# Patient Record
Sex: Female | Born: 1965 | Race: White | Hispanic: No | Marital: Married | Smoking: Former smoker
Health system: Southern US, Community
[De-identification: ages and names within clinical notes are randomized; demographics above are authoritative.]

## PROBLEM LIST (undated history)

## (undated) DIAGNOSIS — E079 Disorder of thyroid, unspecified: Secondary | ICD-10-CM

## (undated) DIAGNOSIS — I10 Essential (primary) hypertension: Secondary | ICD-10-CM

## (undated) DIAGNOSIS — R32 Unspecified urinary incontinence: Secondary | ICD-10-CM

## (undated) DIAGNOSIS — H9190 Unspecified hearing loss, unspecified ear: Secondary | ICD-10-CM

## (undated) DIAGNOSIS — E119 Type 2 diabetes mellitus without complications: Secondary | ICD-10-CM

## (undated) DIAGNOSIS — N951 Menopausal and female climacteric states: Secondary | ICD-10-CM

## (undated) HISTORY — DX: Unspecified hearing loss, unspecified ear: H91.90

## (undated) HISTORY — DX: Unspecified urinary incontinence: R32

## (undated) HISTORY — DX: Essential (primary) hypertension: I10

## (undated) HISTORY — PX: TONSILLECTOMY AND ADENOIDECTOMY: SUR1326

## (undated) HISTORY — DX: Disorder of thyroid, unspecified: E07.9

## (undated) HISTORY — DX: Menopausal and female climacteric states: N95.1

---

## 1985-12-18 HISTORY — PX: WISDOM TOOTH EXTRACTION: SHX21

## 1998-06-12 ENCOUNTER — Inpatient Hospital Stay (HOSPITAL_COMMUNITY): Admission: AD | Admit: 1998-06-12 | Discharge: 1998-06-12 | Payer: Self-pay | Admitting: *Deleted

## 1998-07-06 ENCOUNTER — Inpatient Hospital Stay (HOSPITAL_COMMUNITY): Admission: AD | Admit: 1998-07-06 | Discharge: 1998-07-06 | Payer: Self-pay | Admitting: *Deleted

## 1998-08-10 ENCOUNTER — Ambulatory Visit (HOSPITAL_COMMUNITY): Admission: RE | Admit: 1998-08-10 | Discharge: 1998-08-10 | Payer: Self-pay | Admitting: *Deleted

## 1998-10-05 ENCOUNTER — Emergency Department (HOSPITAL_COMMUNITY): Admission: EM | Admit: 1998-10-05 | Discharge: 1998-10-05 | Payer: Self-pay | Admitting: Emergency Medicine

## 1999-01-03 ENCOUNTER — Ambulatory Visit (HOSPITAL_COMMUNITY): Admission: RE | Admit: 1999-01-03 | Discharge: 1999-01-03 | Payer: Self-pay | Admitting: *Deleted

## 1999-01-13 ENCOUNTER — Inpatient Hospital Stay (HOSPITAL_COMMUNITY): Admission: AD | Admit: 1999-01-13 | Discharge: 1999-01-13 | Payer: Self-pay | Admitting: *Deleted

## 1999-01-18 ENCOUNTER — Inpatient Hospital Stay (HOSPITAL_COMMUNITY): Admission: AD | Admit: 1999-01-18 | Discharge: 1999-01-20 | Payer: Self-pay | Admitting: *Deleted

## 1999-03-20 ENCOUNTER — Emergency Department (HOSPITAL_COMMUNITY): Admission: EM | Admit: 1999-03-20 | Discharge: 1999-03-20 | Payer: Self-pay | Admitting: Emergency Medicine

## 1999-03-28 ENCOUNTER — Emergency Department (HOSPITAL_COMMUNITY): Admission: EM | Admit: 1999-03-28 | Discharge: 1999-03-28 | Payer: Self-pay | Admitting: Emergency Medicine

## 1999-03-28 ENCOUNTER — Encounter: Payer: Self-pay | Admitting: Emergency Medicine

## 1999-03-31 ENCOUNTER — Encounter: Payer: Self-pay | Admitting: Emergency Medicine

## 1999-03-31 ENCOUNTER — Ambulatory Visit (HOSPITAL_COMMUNITY): Admission: RE | Admit: 1999-03-31 | Discharge: 1999-03-31 | Payer: Self-pay | Admitting: Emergency Medicine

## 2001-03-07 ENCOUNTER — Emergency Department (HOSPITAL_COMMUNITY): Admission: EM | Admit: 2001-03-07 | Discharge: 2001-03-07 | Payer: Self-pay | Admitting: Emergency Medicine

## 2001-09-01 ENCOUNTER — Encounter: Payer: Self-pay | Admitting: Obstetrics and Gynecology

## 2001-09-01 ENCOUNTER — Inpatient Hospital Stay (HOSPITAL_COMMUNITY): Admission: AD | Admit: 2001-09-01 | Discharge: 2001-09-01 | Payer: Self-pay | Admitting: Obstetrics and Gynecology

## 2001-09-02 ENCOUNTER — Inpatient Hospital Stay (HOSPITAL_COMMUNITY): Admission: AD | Admit: 2001-09-02 | Discharge: 2001-09-02 | Payer: Self-pay | Admitting: Obstetrics and Gynecology

## 2001-12-18 HISTORY — PX: TUBAL LIGATION: SHX77

## 2002-07-22 ENCOUNTER — Inpatient Hospital Stay (HOSPITAL_COMMUNITY): Admission: AD | Admit: 2002-07-22 | Discharge: 2002-07-24 | Payer: Self-pay | Admitting: Obstetrics & Gynecology

## 2002-08-26 ENCOUNTER — Other Ambulatory Visit: Admission: RE | Admit: 2002-08-26 | Discharge: 2002-08-26 | Payer: Self-pay | Admitting: Obstetrics & Gynecology

## 2002-09-03 ENCOUNTER — Encounter (INDEPENDENT_AMBULATORY_CARE_PROVIDER_SITE_OTHER): Payer: Self-pay

## 2002-09-03 ENCOUNTER — Ambulatory Visit (HOSPITAL_COMMUNITY): Admission: RE | Admit: 2002-09-03 | Discharge: 2002-09-03 | Payer: Self-pay | Admitting: Obstetrics & Gynecology

## 2003-02-11 ENCOUNTER — Emergency Department (HOSPITAL_COMMUNITY): Admission: EM | Admit: 2003-02-11 | Discharge: 2003-02-11 | Payer: Self-pay | Admitting: Emergency Medicine

## 2003-02-11 ENCOUNTER — Encounter: Payer: Self-pay | Admitting: Emergency Medicine

## 2003-06-07 ENCOUNTER — Emergency Department (HOSPITAL_COMMUNITY): Admission: EM | Admit: 2003-06-07 | Discharge: 2003-06-07 | Payer: Self-pay | Admitting: Emergency Medicine

## 2004-04-11 ENCOUNTER — Emergency Department (HOSPITAL_COMMUNITY): Admission: EM | Admit: 2004-04-11 | Discharge: 2004-04-11 | Payer: Self-pay

## 2006-04-18 ENCOUNTER — Encounter: Payer: Self-pay | Admitting: *Deleted

## 2006-05-17 ENCOUNTER — Emergency Department (HOSPITAL_COMMUNITY): Admission: EM | Admit: 2006-05-17 | Discharge: 2006-05-17 | Payer: Self-pay | Admitting: Emergency Medicine

## 2006-05-25 ENCOUNTER — Emergency Department (HOSPITAL_COMMUNITY): Admission: EM | Admit: 2006-05-25 | Discharge: 2006-05-25 | Payer: Self-pay | Admitting: Emergency Medicine

## 2007-11-26 ENCOUNTER — Emergency Department (HOSPITAL_COMMUNITY): Admission: EM | Admit: 2007-11-26 | Discharge: 2007-11-26 | Payer: Self-pay | Admitting: Emergency Medicine

## 2008-09-27 ENCOUNTER — Emergency Department (HOSPITAL_COMMUNITY): Admission: EM | Admit: 2008-09-27 | Discharge: 2008-09-27 | Payer: Self-pay | Admitting: Emergency Medicine

## 2010-03-30 ENCOUNTER — Ambulatory Visit: Payer: Self-pay | Admitting: Obstetrics and Gynecology

## 2010-04-01 ENCOUNTER — Ambulatory Visit (HOSPITAL_COMMUNITY): Admission: RE | Admit: 2010-04-01 | Discharge: 2010-04-01 | Payer: Self-pay | Admitting: Obstetrics & Gynecology

## 2010-04-07 ENCOUNTER — Encounter: Admission: RE | Admit: 2010-04-07 | Discharge: 2010-04-07 | Payer: Self-pay | Admitting: Obstetrics & Gynecology

## 2010-04-13 ENCOUNTER — Other Ambulatory Visit: Admission: RE | Admit: 2010-04-13 | Discharge: 2010-04-13 | Payer: Self-pay | Admitting: Obstetrics and Gynecology

## 2010-04-13 ENCOUNTER — Ambulatory Visit: Payer: Self-pay | Admitting: Obstetrics & Gynecology

## 2010-04-28 ENCOUNTER — Ambulatory Visit: Payer: Self-pay | Admitting: Obstetrics and Gynecology

## 2010-04-28 LAB — CONVERTED CEMR LAB
HCT: 43.8 % (ref 36.0–46.0)
Hemoglobin: 15.1 g/dL — ABNORMAL HIGH (ref 12.0–15.0)
MCHC: 34.5 g/dL (ref 30.0–36.0)

## 2010-06-08 ENCOUNTER — Emergency Department (HOSPITAL_COMMUNITY): Admission: EM | Admit: 2010-06-08 | Discharge: 2010-06-08 | Payer: Self-pay | Admitting: Emergency Medicine

## 2010-07-14 ENCOUNTER — Ambulatory Visit: Payer: Self-pay | Admitting: Obstetrics and Gynecology

## 2010-08-10 ENCOUNTER — Ambulatory Visit: Payer: Self-pay | Admitting: Obstetrics & Gynecology

## 2010-08-10 LAB — CONVERTED CEMR LAB
CO2: 21 meq/L (ref 19–32)
Calcium: 9.5 mg/dL (ref 8.4–10.5)
Creatinine, Ser: 0.71 mg/dL (ref 0.40–1.20)
Glucose, Bld: 110 mg/dL — ABNORMAL HIGH (ref 70–99)

## 2010-09-30 ENCOUNTER — Ambulatory Visit: Payer: Self-pay | Admitting: Family Medicine

## 2010-10-11 ENCOUNTER — Ambulatory Visit (HOSPITAL_COMMUNITY): Admission: RE | Admit: 2010-10-11 | Discharge: 2010-10-11 | Payer: Self-pay | Admitting: *Deleted

## 2010-10-11 ENCOUNTER — Ambulatory Visit: Payer: Self-pay | Admitting: Obstetrics & Gynecology

## 2010-10-11 HISTORY — PX: ENDOMETRIAL ABLATION W/ NOVASURE: SUR434

## 2010-12-23 ENCOUNTER — Ambulatory Visit: Admit: 2010-12-23 | Payer: Self-pay | Admitting: Family Medicine

## 2011-03-01 LAB — CBC
HCT: 42.7 % (ref 36.0–46.0)
Hemoglobin: 14.7 g/dL (ref 12.0–15.0)
MCV: 92.2 fL (ref 78.0–100.0)
Platelets: 318 10*3/uL (ref 150–400)
RBC: 4.62 MIL/uL (ref 3.87–5.11)
WBC: 11.1 10*3/uL — ABNORMAL HIGH (ref 4.0–10.5)

## 2011-03-05 LAB — URINALYSIS, ROUTINE W REFLEX MICROSCOPIC
Protein, ur: 100 mg/dL — AB
Specific Gravity, Urine: 1.025 (ref 1.005–1.030)
Urobilinogen, UA: 0.2 mg/dL (ref 0.0–1.0)

## 2011-03-05 LAB — CBC
HCT: 42.5 % (ref 36.0–46.0)
Hemoglobin: 15.1 g/dL — ABNORMAL HIGH (ref 12.0–15.0)
MCH: 33.9 pg (ref 26.0–34.0)
MCHC: 35.5 g/dL (ref 30.0–36.0)
MCV: 95.5 fL (ref 78.0–100.0)
Platelets: 321 10*3/uL (ref 150–400)
RBC: 4.45 MIL/uL (ref 3.87–5.11)
RDW: 12.7 % (ref 11.5–15.5)
WBC: 13.4 10*3/uL — ABNORMAL HIGH (ref 4.0–10.5)

## 2011-03-05 LAB — DIFFERENTIAL
Basophils Relative: 0 % (ref 0–1)
Lymphs Abs: 3.1 10*3/uL (ref 0.7–4.0)
Neutro Abs: 9 10*3/uL — ABNORMAL HIGH (ref 1.7–7.7)
Neutrophils Relative %: 67 % (ref 43–77)

## 2011-03-05 LAB — URINE CULTURE

## 2011-03-05 LAB — URINE MICROSCOPIC-ADD ON

## 2011-03-05 LAB — WET PREP, GENITAL

## 2011-03-07 LAB — POCT PREGNANCY, URINE: Preg Test, Ur: NEGATIVE

## 2011-03-08 LAB — POCT URINALYSIS DIP (DEVICE)
Hgb urine dipstick: NEGATIVE
Protein, ur: NEGATIVE mg/dL
Specific Gravity, Urine: >=1.03 (ref 1.005–1.030)
Urobilinogen, UA: 0.2 mg/dL (ref 0.0–1.0)
pH: 5 (ref 5.0–8.0)

## 2011-03-12 ENCOUNTER — Emergency Department (HOSPITAL_COMMUNITY): Admission: EM | Admit: 2011-03-12 | Payer: Self-pay | Source: Home / Self Care

## 2011-05-05 NOTE — Discharge Summary (Signed)
   NAME:  [name], [name]                         ACCOUNT NO.:  [id number]   MEDICAL RECORD NO.:  [id number]                   PATIENT TYPE:  INP   LOCATION:  9109                                 FACILITY:  [facility]   PHYSICIAN:  Gerrit Friends. Aldona Bar, M.D.                DATE OF BIRTH:  [birth year]   DATE OF ADMISSION:  07/22/2002  DATE OF DISCHARGE:  07/24/2002                                 DISCHARGE SUMMARY   DISCHARGE DIAGNOSES:  1. Term pregnancy, delivered, 7 pound 13 ounce female infant, Apgar's 9 and 9.  2. Blood type O+.   PROCEDURE:  1. Normal spontaneous delivery.  2. Episiotomy and repair.   SUMMARY:  This 44 year old gravida 4, para 2, was admitted at term in labor  after a relatively uncomplicated pregnancy.  She had a good progression of  her labor with a subsequent normal spontaneous delivery of a viable female  infant with Apgar's of 9 and 9, weighing 7 pounds 13 ounces over a midline  episiotomy which was subsequently repaired without difficulty.  Her  postpartum course was benign.  Her discharge hemoglobin was 12.3, with a  white blood cell count of 16,800, and a platelet count of 391,000.  On the  morning of 07/24/02, she was ambulating well, tolerating a regular diet well,  bottle feeding without difficulty, having normal bowel and bladder function,  was afebrile, and her vital signs were stable, and she was ready for  discharge.  Accordingly, she was given all appropriate instructions and  understood all instructions well.   DISCHARGE MEDICATIONS:  1. Vitamins one q.d.  2. Motrin 600 mg q.6h. p.r.n.  3. Tylox one or two q.4-6h. p.r.n. severe pain.   FOLLOWUP:  She will return to the office for followup in approximately four  weeks' time.   CONDITION ON DISCHARGE:  Improved.                                               Gerrit Friends. Aldona Bar, M.D.    RMW/MEDQ  D:  07/24/2002  T:  07/26/2002  Job:  [id number]

## 2011-05-05 NOTE — Op Note (Signed)
NAME:  [name], [name]                         ACCOUNT NO.:  [id number]   MEDICAL RECORD NO.:  [id number]                   PATIENT TYPE:  AMB   LOCATION:  SDC                                  FACILITY:  [facility]   PHYSICIAN:  Ilda Mori, M.D.                DATE OF BIRTH:  11-18-1966   DATE OF PROCEDURE:  09/03/2002  DATE OF DISCHARGE:                                 OPERATIVE REPORT   PREOPERATIVE DIAGNOSES:  Voluntary sterilization.   POSTOPERATIVE DIAGNOSES:  Voluntary sterilization, 5 cm right paratubal  cyst.   PROCEDURE:  Laparoscopic cautery of the left fallopian tube and distal  salpingostomy and removal of right paratubal cyst.   SURGEON:  Ilda Mori, M.D.   ANESTHESIA:  General endotracheal.   ESTIMATED BLOOD LOSS:  20 cc.   FINDINGS:  The left adnexa were normal.  The right adnexa showed a 5 mm  paratubal cyst on the distal portion of the fallopian tube.  The right ovary  was normal.   INDICATIONS:  This is a 45 year old gravida 4, para 3, AB1 who requested  permanent sterilization.  Alternatives were discussed with the patient as  well as the failure rate for tubal ligation and the fact that tubal ligation  was permanent.  The patient understood this information and elected to  proceed.   PROCEDURE:  The patient was brought to the operating room and placed in the  supine position.  General endotracheal anesthesia was induced.  The abdomen  was prepped and draped in a sterile fashion.  The patient was placed in the  dorsal lithotomy position.  The vagina and perineum were also prepped and  draped in a sterile fashion.  A Hulka tenaculum was placed in the  endocervical canal and fixed to the anterior lip of the cervix.  The uterus  and cervix were elevated and a Veress needle was introduced to the cul-de-  sac and a pneumoperitoneum was created.  The surgeon regowned and gloved.  A  small incision was made at the base of the umbilicus and a 5 mm probe was  placed under direct visualization with a 5 mm laparoscope.  An accessory  instrument was placed through a suprapubic stab wound.  The pelvis was  viewed and the paratubal cyst was noted to be present and it was elected to  remove this.  Another 5 mm port was placed in the right lower quadrant and  the 5 mm port in the suprapubic area was enlarged to a 10 mm port.  The left  tube was cauterized along a 3-4 cm length until no current was flowing  through the tube.  On the right side the right tube was cauterized and then  the connections from the paratubal cyst to the ovary were cauterized and the  distal portion of the right tube including the paratubal cyst was freed from  the adnexal area.  An Endo bag was placed through the 10 mm suprapubic port  and the specimen was placed in the bag and the specimen was removed.  The  adnexa were examined and were without bleeding.  The right ureter seemed to  be peristalsing well below the site of the surgery.  The procedure was then  terminated.  The gas was allowed to  escape.  The umbilical incision and the right lower quadrant incision were  closed with interrupted suture.  The fascia and the 10 mm port in the  midline were closed with a single Vicryl suture and the skin was closed with  subcuticular 4-0 Vicryl suture.  The patient tolerated procedure well and  left the operating room in good condition.                                               Ilda Mori, M.D.    RK/MEDQ  D:  09/03/2002  T:  09/03/2002  Job:  [id number]

## 2011-09-29 ENCOUNTER — Other Ambulatory Visit (HOSPITAL_COMMUNITY): Payer: Self-pay | Admitting: Internal Medicine

## 2011-09-29 DIAGNOSIS — Z1231 Encounter for screening mammogram for malignant neoplasm of breast: Secondary | ICD-10-CM

## 2011-10-12 ENCOUNTER — Ambulatory Visit (HOSPITAL_COMMUNITY): Payer: Medicaid Other

## 2011-11-01 ENCOUNTER — Ambulatory Visit (HOSPITAL_COMMUNITY)
Admission: RE | Admit: 2011-11-01 | Discharge: 2011-11-01 | Disposition: A | Discharge status: Home / Self Care | Payer: Medicaid Other | Source: Ambulatory Visit | Attending: Internal Medicine | Admitting: Internal Medicine

## 2011-11-01 ENCOUNTER — Encounter: Payer: Self-pay | Admitting: Physician Assistant

## 2011-11-01 ENCOUNTER — Ambulatory Visit (INDEPENDENT_AMBULATORY_CARE_PROVIDER_SITE_OTHER): Payer: Medicaid Other | Admitting: Physician Assistant

## 2011-11-01 ENCOUNTER — Other Ambulatory Visit (HOSPITAL_COMMUNITY)
Admission: RE | Admit: 2011-11-01 | Discharge: 2011-11-01 | Disposition: A | Discharge status: Home / Self Care | Payer: Medicaid Other | Source: Ambulatory Visit | Attending: Physician Assistant | Admitting: Physician Assistant

## 2011-11-01 DIAGNOSIS — N951 Menopausal and female climacteric states: Secondary | ICD-10-CM | POA: Insufficient documentation

## 2011-11-01 DIAGNOSIS — Z1231 Encounter for screening mammogram for malignant neoplasm of breast: Secondary | ICD-10-CM | POA: Insufficient documentation

## 2011-11-01 DIAGNOSIS — Z124 Encounter for screening for malignant neoplasm of cervix: Secondary | ICD-10-CM

## 2011-11-01 DIAGNOSIS — Z01419 Encounter for gynecological examination (general) (routine) without abnormal findings: Secondary | ICD-10-CM | POA: Insufficient documentation

## 2011-11-01 DIAGNOSIS — R32 Unspecified urinary incontinence: Secondary | ICD-10-CM | POA: Insufficient documentation

## 2011-11-01 HISTORY — DX: Unspecified urinary incontinence: R32

## 2011-11-01 HISTORY — DX: Menopausal and female climacteric states: N95.1

## 2011-11-01 MED ORDER — AMITRIPTYLINE HCL 75 MG PO TABS: 75.0000 mg | ORAL_TABLET | Freq: Every day | ORAL | Status: DC

## 2011-11-01 NOTE — Progress Notes (Signed)
Pt had mammogram today

## 2011-11-01 NOTE — Progress Notes (Signed)
Chief Complaint:  Gynecologic Exam   [name] is  45 y.o. [id number].  No LMP recorded. Patient has had an ablation..    She presents complaining of Gynecologic Exam Reports increasing hot flashes, night sweats, and mood swings.   Obstetrical/Gynecological History: Pertinent Gynecological History: Menses: Novasure 2011 Bleeding: none Contraception: tubal ligation and s/p ablation DES exposure: denies Blood transfusions: none Sexually transmitted diseases: no past history Previous GYN Procedures: Novasure  Last mammogram: today awaiting results Last pap: normal Date: ~ 1 year ago   Past Medical History: Past Medical History  Diagnosis Date  . Hypertension   . Hearing difficulty   . Thyroid disease     overactive- no meds  . Incontinence of urine 11/01/2011  . Menopausal syndrome 11/01/2011    Past Surgical History: Past Surgical History  Procedure Date  . Endometrial ablation w/ novasure 10/11/2010  . Wisdom tooth extraction 1987  . Tonsillectomy and adenoidectomy age 3  . Tubal ligation 2003    Family History: Family History  Problem Relation Age of Onset  . Cancer Mother     lung  . Diabetes Mother   . Early death Mother     age 33  . Hyperlipidemia Father   . Hypertension Father   . Cancer Sister     uterine- hyaterectomy    Social History: History  Substance Use Topics  . Smoking status: Current Everyday Smoker -- 0.2 packs/day for 24 years    Types: Cigarettes  . Smokeless tobacco: Not on file  . Alcohol Use: Yes     socially    Allergies: No Known Allergies   Review of Systems - History obtained from the patient General ROS: positive for  - hot flashes, night sweats and sleep disturbance ENT ROS: negative for - headaches Allergy and Immunology ROS: negative Hematological and Lymphatic ROS: negative Endocrine ROS: negative for - breast changes or hair pattern changes Breast ROS: negative for breast lumps positive for -  tenderness Respiratory ROS: no cough, shortness of breath, or wheezing Cardiovascular ROS: no chest pain or dyspnea on exertion Gastrointestinal ROS: no abdominal pain, change in bowel habits, or black or bloody stools Genito-Urinary ROS: positive for - incontinence Musculoskeletal ROS: negative Dermatological ROS: negative  Physical Exam   Blood pressure 134/85, pulse 86, temperature 96.8 F (36 C), temperature source Oral, resp. rate 20, height 5\' 7"  (1.702 m), weight 165 lb 14.4 oz (75.252 kg).  General: General appearance - alert, well appearing, and in no distress and oriented to person, place, and time Mental status - alert, oriented to person, place, and time, normal mood, behavior, speech, dress, motor activity, and thought processes, affect appropriate to mood Abdomen - soft, nontender, nondistended, no masses or organomegaly Breasts - breasts appear normal, no suspicious masses, no skin or nipple changes or axillary nodes Extremities - peripheral pulses normal, no pedal edema, no clubbing or cyanosis Focused Gynecological Exam: VULVA: normal appearing vulva with no masses, tenderness or lesions, VAGINA: normal appearing vagina with normal color and discharge, no lesions, PELVIC FLOOR EXAM: no cystocele, rectocele or prolapse noted, cystocele grade II, CERVIX: normal appearing cervix without discharge or lesions, multiparous os, UTERUS: uterus is normal size, shape, consistency and nontender, ADNEXA: non palp, non tender     Assessment: Routine GYN Exam Patient Active Problem List  Diagnoses  . Incontinence of urine  . Menopausal syndrome    Plan: Pap sent per routine Will obtain today's mammogram result Discussed kegel exercises and possible pessary  for pelvic floor support to help with incontinence Rx Elavil 75mg  po @ hs for menopausal s/s since pt not candidate for HRT  FU for pessary fitting in 2-3 weeks  Joyanne Eddinger E. 11/01/2011,2:52 PM

## 2011-11-01 NOTE — Patient Instructions (Signed)
Patient information: Pelvic muscle (Kegel) exercises (The Basics)Written by the doctors and editors at [employer]  What are pelvic muscle exercises? - Pelvic muscle exercises are exercises that strengthen the muscles that control the flow of urine and bowel movements. These exercises are also known as "Kegel" exercises. They can help keep you from leaking urine, gas, or bowel movements, if leaks are a problem for you. They can also help with a condition that affects women called "pelvic organ prolapse." In women who have pelvic organ prolapse, the organs in the lower belly drop down and press against or bulge into the vagina.  How do I learn how to do Kegel exercises? - First ask your doctor or nurse how to do them right. He or she can help you get started.  You will need to learn which muscles to tighten. It is sometimes hard to figure out the right muscles.  A woman might learn to do Kegel exercises by:  Putting a finger inside her vagina and squeezing the muscles around her finger; or  Pretending that she is sitting on a marble and has to pick up the marble using her vagina A man might learn to do Kegels by tightening his butt muscles as if he were holding back gas.  Both men and women can also learn to do Kegel exercises by stopping and starting the flow of urine. If you do this, make sure to do this only once or twice to figure out the correct muscles. Some doctors think you should not do this at all, because if you get in the habit of doing it, it could damage your bladder.  No matter how you learn to do Kegel exercises, the important thing to know is that the muscles involved are not in your belly or your thighs.  After you learn which muscles to tighten, you can do the exercises in any position (sitting in a chair or lying down). You do not need to do them while you are in the bathroom.  How often should I do the exercises? - Do the exercises 2 to 3 times a day, every day. Each time, flex your  muscles about 10 times, and hold them tight for a few seconds each time you tighten. Keep up this routine for at least 4 or 5 months. You will probably see results, but it might take a little time.  How do Kegel exercises help? - Kegel exercises can help: Reduce urine leaks in people who have "stress incontinence," which means they leak urine when they cough, laugh, sneeze, or strain  Control sudden urges to urinate that happen to people with "urge incontinence"  Control the release of gas or bowel movements  Reduce pressure or bulging in the vagina caused by pelvic organ prolapse. (If you have a bulge in the vagina, see your doctor or nurse to find out the cause.) Kegel exercises might also reduce urine leaks in men who have had surgery to treat prostate cancer or an enlarged prostate.  Pap Test A Pap test is a sampling of cells from a woman's cervix. The cervix is the opening between the vagina (birth canal) and the uterus (the bottom part of the womb). The cells are scraped from the cervix during a pelvic exam. These cells are then looked at under a microscope to see if the cells are normal or to see if a cancer is developing or there are changes that suggest a cancer will develop. Cervical dysplasia is a condition in  which a woman has abnormal changes in the top layer of cells of her cervix. These changes are an early sign that cervical cancer may develop. Pap tests also look for the human papilloma virus (HPV) because it has 4 types that are responsible for 70% of cervical cancer. Infections can also be found during a Pap test such as bacteria, fungus, protozoa and viruses.  Cervical cancer is harder to treat and less likely to have a good outcome if left untreated. Catching the disease at an early stage leads to a better outcome. Since the Pap test was introduced 60 years ago, deaths from cervical cancer have decreased by 70%. Every woman should keep up to date with Pap tests. RISK FACTORS FOR  CERVICAL CANCER INCLUDE:  Becoming sexually active before age 34.  Being the daughter of a woman who took diethylstilbestrol (DES) during pregnancy.  Having a sexual partner who has or has had cancer of the penis.  Having a sexual partner whose past partner had cervical cancer or cervical dysplasia (early cell changes which suggest a cancer may develop).  Having a weakened immune system. An example would be HIV or other immunodeficiency disorder.  Having had a sexually transmitted infection such as chlamydia, gonorrhea or HPV.  Having had an abnormal Pap or cancer of the vagina or vulva.  Having had more than one sexual partner.  A history of cervical cancer in a woman's sister or mother.  Not using condoms with new sexual partners.  Smoking.  WHO SHOULD HAVE PAP TESTS A Pap test is done to screen for cervical cancer.  The first Pap test should be done at age 59.  Between ages 63 and 18, Pap tests are repeated every 2 years.  Beginning at age 84, you are advised to have a Pap test every 3 years as long as your past 3 Pap tests have been normal.  Some women have medical problems that increase the chance of getting cervical cancer. Talk to your caregiver about these problems. It is especially important to talk to your caregiver if a new problem develops soon after your last Pap test. In these cases, your caregiver may recommend more frequent screening and Pap tests.  The above recommendations are the same for women who have or have not gotten the vaccine for HPV (Human Papillomavirus).  If you had a hysterectomy for a problem that was not a cancer or a condition that could lead to cancer, then you no longer need Pap tests. However, even if you no longer need a Pap test, a regular exam is a good idea to make sure no other problems are starting.   If you are between ages 62 and 43, and you have had normal Pap tests going back 10 years, you no longer need Pap tests. However, even if you no longer  need a Pap test, a regular exam is a good idea to make sure no other problems are starting.   If you have had past treatment for cervical cancer or a condition that could lead to cancer, you need Pap tests and screening for cancer for at least 20 years after your treatment.  If Pap tests have been discontinued, risk factors (such as a new sexual partner) need to be re-assessed to determine if screening should be resumed.  Some women may need screenings more often if they are at high risk for cervical cancer.  PREPARATION FOR A PAP TEST A Pap test should be performed during the weeks before  the start of menstruation. Women should not douche or have sexual intercourse for 24 hours before the test. No vaginal creams, diaphragms, or tampons should be used for 24 hours before the test. To minimize discomfort, a woman should empty her bladder just before the exam. TAKING THE PAP TEST The caregiver will perform a pelvic exam. A metal or plastic instrument (speculum) is placed in the vagina. This is done before your caregiver does a bimanual exam of your internal female organs. This instrument allows your caregiver to see the inside of the vagina and look at the cervix. A small, sterile brush is used to take a sample of cells from the internal opening of the cervix. A small wooden spatula is used to scrape the outside of the cervix. Neither of these two methods to collect cells will cause you pain. These two scrapings are placed on a glass slide or in a small bottle filled with a special liquid. The cells are looked at later under a microscope in a lab. A specialist will look at these cells and determine if the cells are normal. RESULTS OF YOUR PAP TEST A healthy Pap test shows no abnormal cells or evidence of inflammation.  The presence of abnormally growing cells on the surface of the cervix may be reported as an abnormal Pap test. Different categories of findings are used to describe your Pap test. Your  caregiver will go over the importance of these findings with you. The caregiver will then determine what follow-up is needed or when you should have your next pap test.  If you have had two or more abnormal Pap tests:  You may be asked to have a colposcopy. This is a test in which the cervix is viewed with a special lighted microscope.  A cervical tissue sample (biopsy) may also be needed. This involves taking a small tissue sample from the cervix. The sample is looked at under a microscope to find the cause of the abnormal cells. Make sure you find out the results of the Pap test. If you have not received the results within two weeks, contact your caregiver's office for the results. Do not assume everything is normal if you have not heard from your caregiver or medical facility. It is important to follow up on all of your test results.  Document Released: 02/24/2003 Document Revised: 08/16/2011 Document Reviewed: 04/27/2008 [facility] Patient Information 2012 [place], [place].

## 2011-12-01 ENCOUNTER — Ambulatory Visit: Payer: Medicaid Other | Admitting: Physician Assistant

## 2012-04-23 ENCOUNTER — Other Ambulatory Visit: Payer: Self-pay | Admitting: Otolaryngology

## 2012-04-23 DIAGNOSIS — J329 Chronic sinusitis, unspecified: Secondary | ICD-10-CM

## 2012-04-29 ENCOUNTER — Ambulatory Visit
Admission: RE | Admit: 2012-04-29 | Discharge: 2012-04-29 | Disposition: A | Discharge status: Home / Self Care | Payer: Medicaid Other | Source: Ambulatory Visit | Attending: Otolaryngology | Admitting: Otolaryngology

## 2012-04-29 DIAGNOSIS — J329 Chronic sinusitis, unspecified: Secondary | ICD-10-CM

## 2012-05-17 ENCOUNTER — Other Ambulatory Visit: Payer: Self-pay | Admitting: Otolaryngology

## 2012-08-27 ENCOUNTER — Emergency Department (HOSPITAL_COMMUNITY)
Admission: EM | Admit: 2012-08-27 | Discharge: 2012-08-27 | Disposition: A | Discharge status: Home / Self Care | Payer: Medicaid Other | Attending: Emergency Medicine | Admitting: Emergency Medicine

## 2012-08-27 ENCOUNTER — Encounter (HOSPITAL_COMMUNITY): Payer: Self-pay | Admitting: *Deleted

## 2012-08-27 ENCOUNTER — Emergency Department (HOSPITAL_COMMUNITY): Payer: Medicaid Other

## 2012-08-27 DIAGNOSIS — Z8049 Family history of malignant neoplasm of other genital organs: Secondary | ICD-10-CM | POA: Insufficient documentation

## 2012-08-27 DIAGNOSIS — Z8489 Family history of other specified conditions: Secondary | ICD-10-CM | POA: Insufficient documentation

## 2012-08-27 DIAGNOSIS — Z8249 Family history of ischemic heart disease and other diseases of the circulatory system: Secondary | ICD-10-CM | POA: Insufficient documentation

## 2012-08-27 DIAGNOSIS — W260XXA Contact with knife, initial encounter: Secondary | ICD-10-CM | POA: Insufficient documentation

## 2012-08-27 DIAGNOSIS — S61219A Laceration without foreign body of unspecified finger without damage to nail, initial encounter: Secondary | ICD-10-CM

## 2012-08-27 DIAGNOSIS — Z23 Encounter for immunization: Secondary | ICD-10-CM | POA: Insufficient documentation

## 2012-08-27 DIAGNOSIS — Z801 Family history of malignant neoplasm of trachea, bronchus and lung: Secondary | ICD-10-CM | POA: Insufficient documentation

## 2012-08-27 DIAGNOSIS — S61209A Unspecified open wound of unspecified finger without damage to nail, initial encounter: Secondary | ICD-10-CM | POA: Insufficient documentation

## 2012-08-27 DIAGNOSIS — Y93G1 Activity, food preparation and clean up: Secondary | ICD-10-CM | POA: Insufficient documentation

## 2012-08-27 DIAGNOSIS — Z833 Family history of diabetes mellitus: Secondary | ICD-10-CM | POA: Insufficient documentation

## 2012-08-27 DIAGNOSIS — F17200 Nicotine dependence, unspecified, uncomplicated: Secondary | ICD-10-CM | POA: Insufficient documentation

## 2012-08-27 DIAGNOSIS — W261XXA Contact with sword or dagger, initial encounter: Secondary | ICD-10-CM | POA: Insufficient documentation

## 2012-08-27 DIAGNOSIS — I10 Essential (primary) hypertension: Secondary | ICD-10-CM | POA: Insufficient documentation

## 2012-08-27 MED ORDER — TETANUS-DIPHTH-ACELL PERTUSSIS 5-2.5-18.5 LF-MCG/0.5 IM SUSP
0.5000 mL | Freq: Once | INTRAMUSCULAR | Status: AC
  Administered 2012-08-27: 0.5 mL via INTRAMUSCULAR
  Filled 2012-08-27: qty 0.5

## 2012-08-27 MED ORDER — CEPHALEXIN 250 MG PO CAPS: 250.0000 mg | ORAL_CAPSULE | Freq: Four times a day (QID) | ORAL | Status: AC

## 2012-08-27 NOTE — ED Notes (Signed)
PA at the bedside to suture pt finger.

## 2012-08-27 NOTE — ED Notes (Signed)
Pt reports to the ED for eval of left thumb laceration. Was cutting cantaloupe about an hour ago and the knife slipped and cut her thumb. PMS intact. States she said it feels like the knife hit the bone. She can move her thumb but states it hurts. Bleeding controlled.

## 2012-08-27 NOTE — ED Provider Notes (Signed)
History     CSN: [id number]  Arrival date & time 08/27/12  4098   First MD Initiated Contact with Patient 08/27/12 1005      Chief Complaint  Patient presents with  . Finger Injury    (Consider location/radiation/quality/duration/timing/severity/associated sxs/prior treatment) HPI Comments: [name] 46 y.o. female   The chief complaint is: Patient presents with:   Finger Injury    Patient statest that she was cooking today when she dropped a knife which hit her on the dorsal  surface of the left hand. States that she cannot bend thumb.  She washed the thumb under cold water but was unable to control bleeding and came to ED. Denies numbness or tingling  The history is provided by the patient. No language interpreter was used.    Past Medical History  Diagnosis Date  . Hypertension   . Hearing difficulty   . Thyroid disease     overactive- no meds  . Incontinence of urine 11/01/2011  . Menopausal syndrome 11/01/2011    Past Surgical History  Procedure Date  . Endometrial ablation w/ novasure 10/11/2010  . Wisdom tooth extraction 1987  . Tonsillectomy and adenoidectomy age 46  . Tubal ligation 2003    Family History  Problem Relation Age of Onset  . Cancer Mother     lung  . Diabetes Mother   . Early death Mother     age 71  . Hyperlipidemia Father   . Hypertension Father   . Cancer Sister     uterine- hyaterectomy    History  Substance Use Topics  . Smoking status: Current Everyday Smoker -- 0.2 packs/day for 24 years    Types: Cigarettes  . Smokeless tobacco: Not on file  . Alcohol Use: Yes     socially    OB History    Grav Para Term Preterm Abortions TAB SAB Ect Mult Living   3 2 2  1  1   2       Review of Systems  Constitutional: Negative for fever.  Respiratory: Negative for shortness of breath.   Cardiovascular: Negative for chest pain.  Musculoskeletal:       Left thumb pain  Skin: Positive for wound.  Neurological:  Negative for weakness and numbness.    Allergies  Review of patient's allergies indicates no known allergies.  Home Medications   Current Outpatient Rx  Name Route Sig Dispense Refill  . BC HEADACHE POWDER PO Oral Take 1 packet by mouth daily as needed. pain    . LISINOPRIL-HYDROCHLOROTHIAZIDE 10-12.5 MG PO TABS Oral Take 1 tablet by mouth daily.     Marland Kitchen LORATADINE 10 MG PO TABS Oral Take 10 mg by mouth daily.    . CEPHALEXIN 250 MG PO CAPS Oral Take 1 capsule (250 mg total) by mouth 4 (four) times daily. 20 capsule 0    BP 153/94  Pulse 89  Temp 97.8 F (36.6 C) (Oral)  Resp 18  SpO2 96%  Physical Exam  Nursing note and vitals reviewed. Constitutional: She is oriented to person, place, and time. She appears well-developed and well-nourished. No distress.  HENT:  Head: Normocephalic and atraumatic.  Eyes: Conjunctivae normal are normal. No scleral icterus.  Neck: Normal range of motion.  Cardiovascular: Normal rate, regular rhythm and normal heart sounds.  Exam reveals no gallop and no friction rub.   No murmur heard. Pulmonary/Chest: Effort normal and breath sounds normal. No respiratory distress.  Abdominal: Soft. Bowel sounds are normal.  She exhibits no distension and no mass. There is no tenderness. There is no guarding.  Musculoskeletal:       Left hand: She exhibits laceration and swelling. She exhibits normal range of motion.       Hands:      Small laceration to dorsal surface of thumb just distal to DIP. Patient is able to move thumb after administration of digital block.  Full AROM of DIP and MCP joint. Neurovascularly intact.  No foreign bodies or tendon involvement appreciated.   Neurological: She is alert and oriented to person, place, and time.  Skin: Skin is warm and dry. She is not diaphoretic.    ED Course  Procedures (including critical care time) LACERATION REPAIR Performed by: Arthor Captain Authorized by: Arthor Captain Consent: Verbal consent  obtained. Risks and benefits: risks, benefits and alternatives were discussed Consent given by: patient Patient identity confirmed: provided demographic data Prepped and Draped in normal sterile fashion Wound explored  Laceration Location: Dorsal thumb just superior to the DIP  Laceration Length: 1.5 cm  No Foreign Bodies seen or palpated  Anesthesia: Digital block  Local anesthetic: lidocaine 2% without epinephrine  Anesthetic total: 4 ml  Irrigation method: syringe Amount of cleaning: standard  Skin closure: 5.0 prolene  Number of sutures: 4  Technique: so  Patient tolerance: Patient tolerated the procedure well with no immediate complications.    Labs Reviewed - No data to display Dg Finger Thumb Left  08/27/2012  *RADIOLOGY REPORT*  Clinical Data: Finger injury, laceration.  LEFT THUMB 2+V  Comparison: None.  Findings: No acute bony abnormality.  Specifically, no fracture, subluxation, or dislocation.  Soft tissues are intact.  IMPRESSION: No acute bony abnormality.   Original Report Authenticated By: Cyndie Chime, M.D.     Finger laceration. Patient tetanus is updated toiday. Will d/c home with abx prophylaxis and wound care instructions.  1. Finger laceration       MDM  All questions answered fully. Discussed reasons to seek immediate care. Patient expresses understanding and agrees with plan.         Arthor Captain, PA-C 08/31/12 [phone]

## 2012-08-27 NOTE — ED Notes (Signed)
Pt reports cutting left thumb with knife just pta. Pt with well approximated laceration to anterior aspect of DP. Pt unable to bend at joint. Swelling noted. Feeling intact. Bleeding controlled.

## 2012-08-27 NOTE — ED Notes (Signed)
Patient transported to X-ray. PT stable and ready for transport.

## 2012-09-04 NOTE — ED Provider Notes (Signed)
Medical screening examination/treatment/procedure(s) were performed by non-physician practitioner and as supervising physician I was immediately available for consultation/collaboration.  Argelia Formisano, MD 09/04/12 0025 

## 2013-01-25 ENCOUNTER — Emergency Department (HOSPITAL_COMMUNITY)
Admission: EM | Admit: 2013-01-25 | Discharge: 2013-01-25 | Discharge status: Against Medical Advice | Payer: Medicaid Other

## 2013-04-21 ENCOUNTER — Emergency Department (HOSPITAL_COMMUNITY)
Admission: EM | Admit: 2013-04-21 | Discharge: 2013-04-21 | Disposition: A | Discharge status: Home / Self Care | Payer: Medicaid Other | Attending: Emergency Medicine | Admitting: Emergency Medicine

## 2013-04-21 ENCOUNTER — Encounter (HOSPITAL_COMMUNITY): Payer: Self-pay | Admitting: Cardiology

## 2013-04-21 DIAGNOSIS — Y990 Civilian activity done for income or pay: Secondary | ICD-10-CM | POA: Insufficient documentation

## 2013-04-21 DIAGNOSIS — I10 Essential (primary) hypertension: Secondary | ICD-10-CM | POA: Insufficient documentation

## 2013-04-21 DIAGNOSIS — F17200 Nicotine dependence, unspecified, uncomplicated: Secondary | ICD-10-CM | POA: Insufficient documentation

## 2013-04-21 DIAGNOSIS — T148XXA Other injury of unspecified body region, initial encounter: Secondary | ICD-10-CM | POA: Insufficient documentation

## 2013-04-21 DIAGNOSIS — Y9269 Other specified industrial and construction area as the place of occurrence of the external cause: Secondary | ICD-10-CM | POA: Insufficient documentation

## 2013-04-21 DIAGNOSIS — W1789XA Other fall from one level to another, initial encounter: Secondary | ICD-10-CM | POA: Insufficient documentation

## 2013-04-21 MED ORDER — IBUPROFEN 400 MG PO TABS
600.0000 mg | ORAL_TABLET | Freq: Once | ORAL | Status: AC
  Administered 2013-04-21: 600 mg via ORAL
  Filled 2013-04-21: qty 1

## 2013-04-21 MED ORDER — METHOCARBAMOL 500 MG PO TABS: 500.0000 mg | ORAL_TABLET | Freq: Two times a day (BID) | ORAL | Status: DC

## 2013-04-21 MED ORDER — IBUPROFEN 600 MG PO TABS: 600.0000 mg | ORAL_TABLET | Freq: Four times a day (QID) | ORAL | Status: DC | PRN

## 2013-04-21 MED ORDER — METHOCARBAMOL 500 MG PO TABS
500.0000 mg | ORAL_TABLET | Freq: Once | ORAL | Status: AC
  Administered 2013-04-21: 500 mg via ORAL
  Filled 2013-04-21: qty 1

## 2013-04-21 NOTE — ED Provider Notes (Signed)
Medical screening examination/treatment/procedure(s) were performed by non-physician practitioner and as supervising physician I was immediately available for consultation/collaboration. Devoria Albe, MD, FACEP   Ward Givens, MD 04/21/13 1050

## 2013-04-21 NOTE — ED Notes (Signed)
Pt reports she was at work and pushed the handle forward on the mop and it broke. States she thinks that she pulled her hamstring when she strained forward. No bruising or deformity noted.

## 2013-04-21 NOTE — ED Provider Notes (Signed)
History     CSN: [id number]  Arrival date & time 04/21/13  5621   First MD Initiated Contact with Patient 04/21/13 1021      Chief Complaint  Patient presents with  . Leg Pain    (Consider location/radiation/quality/duration/timing/severity/associated sxs/prior treatment) HPI  47 year old female presents for evaluations of her leg pain. Patient reports while at work she was pushing her mop handle forward when it broke, causing her to fell side way.  Sts she felt a pulled sensation immediately after the fall. Increasing pain to the back of her thigh radiates to her right buttock. She is able to ambulate however with worsening pain. Did not hit her head, no loss of consciousness, no abdominal or back pain. Denies any numbness or weakness. Incident happened one hour ago. No specific treatment tried.  Past Medical History  Diagnosis Date  . Hypertension   . Hearing difficulty   . Thyroid disease     overactive- no meds  . Incontinence of urine 11/01/2011  . Menopausal syndrome 11/01/2011    Past Surgical History  Procedure Laterality Date  . Endometrial ablation w/ novasure  10/11/2010  . Wisdom tooth extraction  1987  . Tonsillectomy and adenoidectomy  age 74  . Tubal ligation  2003    Family History  Problem Relation Age of Onset  . Cancer Mother     lung  . Diabetes Mother   . Early death Mother     age 48  . Hyperlipidemia Father   . Hypertension Father   . Cancer Sister     uterine- hyaterectomy    History  Substance Use Topics  . Smoking status: Current Every Day Smoker -- 0.25 packs/day for 24 years    Types: Cigarettes  . Smokeless tobacco: Not on file  . Alcohol Use: Yes     Comment: socially    OB History   Grav Para Term Preterm Abortions TAB SAB Ect Mult Living   3 2 2  1  1   2       Review of Systems  Constitutional: Negative for fever.  Musculoskeletal: Negative for back pain.  Skin: Negative for rash and wound.  Neurological: Negative for  numbness and headaches.    Allergies  Review of patient's allergies indicates no known allergies.  Home Medications   Current Outpatient Rx  Name  Route  Sig  Dispense  Refill  . Aspirin-Salicylamide-Caffeine (BC HEADACHE POWDER PO)   Oral   Take 1 packet by mouth daily as needed. pain         . lisinopril-hydrochlorothiazide (PRINZIDE,ZESTORETIC) 10-12.5 MG per tablet   Oral   Take 1 tablet by mouth daily.            BP 144/82  Pulse 86  Temp(Src) 97.9 F (36.6 C) (Oral)  Resp 18  SpO2 97%  Physical Exam  Nursing note and vitals reviewed. Constitutional: She appears well-developed and well-nourished. No distress.  HENT:  Head: Atraumatic.  Eyes: Conjunctivae are normal.  Neck: Neck supple.  Abdominal: Soft. There is no tenderness.  Musculoskeletal: She exhibits tenderness (Right leg: Tenderness along the hamstrings in the posterior aspects of thigh to buttock without any deformity noted. Normal hip flexion and extension. Normal knee flexion and extension. Sensation is intact. No overlying skin changes noted. Able to ambulat).  Neurological: She is alert.  Skin: Skin is warm. No rash noted.    ED Course  Procedures (including critical care time)  10:30 AM Pain to posterior  aspect of R thigh likely muscle strain.  Able to ambulate.  RICE therapy discussed.  Ortho referral as needed.    Labs Reviewed - No data to display No results found.   1. Muscle strain       MDM  BP 144/82  Pulse 86  Temp(Src) 97.9 F (36.6 C) (Oral)  Resp 18  SpO2 97%         Fayrene Helper, PA-C 04/21/13 1032

## 2013-05-16 ENCOUNTER — Other Ambulatory Visit: Payer: Self-pay | Admitting: Internal Medicine

## 2013-05-16 DIAGNOSIS — Z1231 Encounter for screening mammogram for malignant neoplasm of breast: Secondary | ICD-10-CM

## 2013-05-22 ENCOUNTER — Ambulatory Visit: Payer: Medicaid Other

## 2014-06-02 ENCOUNTER — Encounter (HOSPITAL_COMMUNITY): Payer: Self-pay | Admitting: Emergency Medicine

## 2014-06-02 ENCOUNTER — Emergency Department (HOSPITAL_COMMUNITY)
Admission: EM | Admit: 2014-06-02 | Discharge: 2014-06-02 | Disposition: A | Discharge status: Home / Self Care | Payer: Medicaid Other | Attending: Emergency Medicine | Admitting: Emergency Medicine

## 2014-06-02 DIAGNOSIS — Z862 Personal history of diseases of the blood and blood-forming organs and certain disorders involving the immune mechanism: Secondary | ICD-10-CM | POA: Insufficient documentation

## 2014-06-02 DIAGNOSIS — H9209 Otalgia, unspecified ear: Secondary | ICD-10-CM | POA: Insufficient documentation

## 2014-06-02 DIAGNOSIS — J069 Acute upper respiratory infection, unspecified: Secondary | ICD-10-CM | POA: Insufficient documentation

## 2014-06-02 DIAGNOSIS — Z79899 Other long term (current) drug therapy: Secondary | ICD-10-CM | POA: Insufficient documentation

## 2014-06-02 DIAGNOSIS — Z8639 Personal history of other endocrine, nutritional and metabolic disease: Secondary | ICD-10-CM | POA: Insufficient documentation

## 2014-06-02 DIAGNOSIS — I10 Essential (primary) hypertension: Secondary | ICD-10-CM | POA: Insufficient documentation

## 2014-06-02 DIAGNOSIS — Z8742 Personal history of other diseases of the female genital tract: Secondary | ICD-10-CM | POA: Insufficient documentation

## 2014-06-02 DIAGNOSIS — R112 Nausea with vomiting, unspecified: Secondary | ICD-10-CM | POA: Insufficient documentation

## 2014-06-02 DIAGNOSIS — Z7982 Long term (current) use of aspirin: Secondary | ICD-10-CM | POA: Insufficient documentation

## 2014-06-02 DIAGNOSIS — Z8669 Personal history of other diseases of the nervous system and sense organs: Secondary | ICD-10-CM | POA: Insufficient documentation

## 2014-06-02 DIAGNOSIS — F17200 Nicotine dependence, unspecified, uncomplicated: Secondary | ICD-10-CM | POA: Insufficient documentation

## 2014-06-02 NOTE — ED Notes (Addendum)
Pt sts sore throat and swollen lymph nodes x a few days. sts painful to swallow. sts she has felt feverish. sts also her ears hurt and she has been nauseous and vomited twice yesterday.

## 2014-06-02 NOTE — ED Provider Notes (Signed)
CSN: [id number]     Arrival date & time 06/02/14  1042 History   First MD Initiated Contact with Patient 06/02/14 1112     Chief Complaint  Patient presents with  . Sore Throat     (Consider location/radiation/quality/duration/timing/severity/associated sxs/prior Treatment) HPI 48 year old female presents with sore throat for 2 days. She states she's also been having bilateral ear pain, bilateral neck pain, headache, dry cough, and intermittent nausea. She's her bodies and diffusely achy as well. Has not noticed any fevers or chills. She has not had any trouble swallowing except it is mildly painful. Her nausea was most pronounced yesterday which he vomited twice but has not had any vomiting today. No change in her voice. She feels like her lymph nodes are swollen on her bilateral neck.  Past Medical History  Diagnosis Date  . Hypertension   . Hearing difficulty   . Thyroid disease     overactive- no meds  . Incontinence of urine 11/01/2011  . Menopausal syndrome 11/01/2011   Past Surgical History  Procedure Laterality Date  . Endometrial ablation w/ novasure  10/11/2010  . Wisdom tooth extraction  1987  . Tonsillectomy and adenoidectomy  age 79  . Tubal ligation  2003   Family History  Problem Relation Age of Onset  . Cancer Mother     lung  . Diabetes Mother   . Early death Mother     age 8  . Hyperlipidemia Father   . Hypertension Father   . Cancer Sister     uterine- hyaterectomy   History  Substance Use Topics  . Smoking status: Current Every Day Smoker -- 0.25 packs/day for 24 years    Types: Cigarettes  . Smokeless tobacco: Not on file  . Alcohol Use: Yes     Comment: socially   OB History   Grav Para Term Preterm Abortions TAB SAB Ect Mult Living   3 2 2  1  1   2      Review of Systems  Constitutional: Negative for fever.  HENT: Positive for congestion, ear pain, sinus pressure and sore throat. Negative for trouble swallowing and voice change.    Respiratory: Positive for cough. Negative for shortness of breath.   Gastrointestinal: Positive for nausea and vomiting. Negative for abdominal pain and diarrhea.  Musculoskeletal: Positive for myalgias.  Neurological: Positive for headaches.  All other systems reviewed and are negative.     Allergies  Review of patient's allergies indicates no known allergies.  Home Medications   Prior to Admission medications   Medication Sig Start Date End Date Taking? Authorizing Provider  Aspirin-Salicylamide-Caffeine (BC HEADACHE POWDER PO) Take 1 packet by mouth daily as needed. pain    Historical Provider, MD  ibuprofen (ADVIL,MOTRIN) 600 MG tablet Take 1 tablet (600 mg total) by mouth every 6 (six) hours as needed for pain. 04/21/13   Domenic Moras, PA-C  methocarbamol (ROBAXIN) 500 MG tablet Take 1 tablet (500 mg total) by mouth 2 (two) times daily. 04/21/13   Domenic Moras, PA-C   BP 118/78  Pulse 85  Temp(Src) 98.1 F (36.7 C)  Resp 18  SpO2 98% Physical Exam  Nursing note and vitals reviewed. Constitutional: She is oriented to person, place, and time. She appears well-developed and well-nourished.  Comfortable, no acute distress  HENT:  Head: Normocephalic and atraumatic.  Right Ear: Tympanic membrane, external ear and ear canal normal.  Left Ear: Tympanic membrane, external ear and ear canal normal.  Nose: Nose normal.  Mouth/Throat: Uvula is midline. No uvula swelling. Posterior oropharyngeal erythema (mild) present. No oropharyngeal exudate, posterior oropharyngeal edema or tonsillar abscesses.  Tonsils surgically absent  Eyes: Pupils are equal, round, and reactive to light. Right eye exhibits no discharge. Left eye exhibits no discharge.  Neck: Normal range of motion. Neck supple.  Cardiovascular: Normal rate, regular rhythm and normal heart sounds.   Pulmonary/Chest: Effort normal and breath sounds normal. She has no wheezes. She has no rales.  Abdominal: Soft. She exhibits no  distension. There is no tenderness.  Lymphadenopathy:    She has no cervical adenopathy (no appreciable lymphadenopathy, but is mildly tender to bilateral anterior cervical chains).  Neurological: She is alert and oriented to person, place, and time.  Skin: Skin is warm and dry.    ED Course  Procedures (including critical care time) Labs Review Labs Reviewed - No data to display  Imaging Review No results found.   EKG Interpretation None      MDM   Final diagnoses:  Viral upper respiratory tract infection    Patient symptoms are consistent with a viral upper respiratory infection. She has 0 points on Centor criteria. No signs of pneumonia or other bacterial illness. At this point she is well-appearing in to be treated with supportive care, fluids, and follow up with her PCP as needed.    Ephraim Hamburger, MD 06/02/14 1126

## 2014-10-19 ENCOUNTER — Encounter (HOSPITAL_COMMUNITY): Payer: Self-pay | Admitting: Emergency Medicine

## 2015-08-26 ENCOUNTER — Other Ambulatory Visit: Payer: Self-pay | Admitting: Internal Medicine

## 2015-08-26 DIAGNOSIS — Z1231 Encounter for screening mammogram for malignant neoplasm of breast: Secondary | ICD-10-CM

## 2015-08-26 DIAGNOSIS — N951 Menopausal and female climacteric states: Secondary | ICD-10-CM

## 2015-09-13 ENCOUNTER — Ambulatory Visit
Admission: RE | Admit: 2015-09-13 | Discharge: 2015-09-13 | Disposition: A | Discharge status: Home / Self Care | Payer: Medicaid Other | Source: Ambulatory Visit | Attending: Internal Medicine | Admitting: Internal Medicine

## 2015-09-13 DIAGNOSIS — Z1231 Encounter for screening mammogram for malignant neoplasm of breast: Secondary | ICD-10-CM

## 2015-09-13 DIAGNOSIS — N951 Menopausal and female climacteric states: Secondary | ICD-10-CM

## 2016-11-15 ENCOUNTER — Encounter: Payer: Self-pay | Admitting: Internal Medicine

## 2016-12-23 ENCOUNTER — Ambulatory Visit (HOSPITAL_COMMUNITY)
Admission: EM | Admit: 2016-12-23 | Discharge: 2016-12-23 | Disposition: A | Discharge status: Home / Self Care | Payer: Medicaid Other | Attending: Family Medicine | Admitting: Family Medicine

## 2016-12-23 ENCOUNTER — Encounter (HOSPITAL_COMMUNITY): Payer: Self-pay | Admitting: Emergency Medicine

## 2016-12-23 DIAGNOSIS — S39012A Strain of muscle, fascia and tendon of lower back, initial encounter: Secondary | ICD-10-CM

## 2016-12-23 MED ORDER — DICLOFENAC POTASSIUM 50 MG PO TABS: 50.0000 mg | ORAL_TABLET | Freq: Three times a day (TID) | ORAL | 0 refills | Status: DC

## 2016-12-23 MED ORDER — CYCLOBENZAPRINE HCL 5 MG PO TABS: 5.0000 mg | ORAL_TABLET | Freq: Three times a day (TID) | ORAL | 1 refills | Status: DC

## 2016-12-23 MED ORDER — KETOROLAC TROMETHAMINE 30 MG/ML IJ SOLN
30.0000 mg | Freq: Once | INTRAMUSCULAR | Status: AC
  Administered 2016-12-23: 30 mg via INTRAMUSCULAR

## 2016-12-23 MED ORDER — KETOROLAC TROMETHAMINE 30 MG/ML IJ SOLN
INTRAMUSCULAR | Status: AC
Start: 2016-12-23 — End: 2016-12-23
  Filled 2016-12-23: qty 1

## 2016-12-23 NOTE — ED Triage Notes (Signed)
The patient presented to the [facility] with a complaint of back pain x 5 days. The patient stated that she was seated and when she stood up her back "gave out."

## 2016-12-23 NOTE — ED Provider Notes (Signed)
[facility]    CSN: [id number] Arrival date & time: 12/23/16  1507     History   Chief Complaint Chief Complaint  Patient presents with  . Back Pain    HPI [name] is a 51 y.o. female.   The history is provided by the patient.  Back Pain  Location:  Lumbar spine Quality:  Stabbing and stiffness Radiates to:  Does not radiate Pain severity:  Mild Onset quality:  Sudden (getting up off toilet and back pain started) Duration:  5 days Progression:  Partially resolved Chronicity:  New Context: not lifting heavy objects   Worsened by:  Sitting and bending Associated symptoms: pelvic pain   Associated symptoms: no abdominal pain, no dysuria, no fever, no leg pain and no tingling     Past Medical History:  Diagnosis Date  . Hearing difficulty   . Hypertension   . Incontinence of urine 11/01/2011  . Menopausal syndrome 11/01/2011  . Thyroid disease    overactive- no meds    Patient Active Problem List   Diagnosis Date Noted  . Incontinence of urine 11/01/2011  . Menopausal syndrome 11/01/2011    Past Surgical History:  Procedure Laterality Date  . ENDOMETRIAL ABLATION W/ NOVASURE  10/11/2010  . TONSILLECTOMY AND ADENOIDECTOMY  age 58  . TUBAL LIGATION  2003  . WISDOM TOOTH EXTRACTION  1987    OB History    Gravida Para Term Preterm AB Living   3 2 2   1 2    SAB TAB Ectopic Multiple Live Births   1               Home Medications    Prior to Admission medications   Medication Sig Start Date End Date Taking? Authorizing Provider  lisinopril-hydrochlorothiazide (PRINZIDE,ZESTORETIC) 10-12.5 MG per tablet Take 1 tablet by mouth daily.   Yes Historical Provider, MD  Aspirin-Salicylamide-Caffeine (BC FAST PAIN RELIEF) 650-195-33.3 MG PACK Take 1 Package by mouth daily as needed (PAIN).    Historical Provider, MD  cyclobenzaprine (FLEXERIL) 10 MG tablet Take 1 tablet (10 mg total) by mouth 2 (two) times daily as needed for muscle spasms.  01/01/17   Gwenyth Allegra Tegeler, MD  INVOKAMET 50-500 MG TABS Take 1 tablet by mouth 2 (two) times daily. 11/14/16   Historical Provider, MD  oxyCODONE-acetaminophen (PERCOCET/ROXICET) 5-325 MG tablet Take 1 tablet by mouth every 6 (six) hours as needed for severe pain. 01/01/17   [name], MD    Family History Family History  Problem Relation Age of Onset  . Cancer Mother     lung  . Diabetes Mother   . Early death Mother     age 36  . Hyperlipidemia Father   . Hypertension Father   . Cancer Sister     uterine- hyaterectomy    Social History Social History  Substance Use Topics  . Smoking status: Current Every Day Smoker    Packs/day: 0.25    Years: 24.00    Types: Cigarettes  . Smokeless tobacco: Not on file  . Alcohol use Yes     Comment: socially     Allergies   Patient has no known allergies.   Review of Systems Review of Systems  Constitutional: Negative.  Negative for fever.  Cardiovascular: Negative.   Gastrointestinal: Negative.  Negative for abdominal pain.  Genitourinary: Positive for pelvic pain. Negative for dysuria, frequency and urgency.  Musculoskeletal: Positive for back pain. Negative for gait problem and joint swelling.  Skin: Negative.   Neurological: Negative.  Negative for tingling.  All other systems reviewed and are negative.    Physical Exam Triage Vital Signs ED Triage Vitals  Enc Vitals Group     BP 12/23/16 1619 110/74     Pulse Rate 12/23/16 1619 91     Resp 12/23/16 1619 18     Temp 12/23/16 1619 98 F (36.7 C)     Temp Source 12/23/16 1619 Oral     SpO2 12/23/16 1619 96 %     Weight --      Height --      Head Circumference --      Peak Flow --      Pain Score 12/23/16 1622 10     Pain Loc --      Pain Edu? --      Excl. in [place]? --    No data found.   Updated Vital Signs BP 110/74 (BP Location: Right Arm)   Pulse 91   Temp 98 F (36.7 C) (Oral)   Resp 18   SpO2 96%   Visual Acuity Right Eye  Distance:   Left Eye Distance:   Bilateral Distance:    Right Eye Near:   Left Eye Near:    Bilateral Near:     Physical Exam  Constitutional: She is oriented to person, place, and time. She appears well-developed and well-nourished. No distress.  Pulmonary/Chest: Effort normal and breath sounds normal.  Abdominal: Soft. Bowel sounds are normal. There is no tenderness.  Musculoskeletal: She exhibits tenderness. She exhibits no deformity.       Lumbar back: She exhibits decreased range of motion, tenderness, pain and spasm. She exhibits no swelling, no edema, no deformity and normal pulse.  Neurological: She is alert and oriented to person, place, and time. She displays normal reflexes. No cranial nerve deficit. Coordination normal.  Skin: Skin is warm and dry.  Nursing note and vitals reviewed.    UC Treatments / Results  Labs (all labs ordered are listed, but only abnormal results are displayed) Labs Reviewed - No data to display  EKG  EKG Interpretation None       Radiology No results found.  Procedures Procedures (including critical care time)  Medications Ordered in UC Medications  ketorolac (TORADOL) 30 MG/ML injection 30 mg (30 mg Intramuscular Given 12/23/16 1635)     Initial Impression / Assessment and Plan / UC Course  I have reviewed the triage vital signs and the nursing notes.  Pertinent labs & imaging results that were available during my care of the patient were reviewed by me and considered in my medical decision making (see chart for details).       Final Clinical Impressions(s) / UC Diagnoses   Final diagnoses:  Strain of lumbar region, initial encounter    New Prescriptions Discharge Medication List as of 12/23/2016  4:43 PM    START taking these medications   Details  cyclobenzaprine (FLEXERIL) 5 MG tablet Take 1 tablet (5 mg total) by mouth 3 (three) times daily. Prn muscle spasms, Starting Sat 12/23/2016, Print    diclofenac (CATAFLAM)  50 MG tablet Take 1 tablet (50 mg total) by mouth 3 (three) times daily. Prn back pain, Starting Sat 12/23/2016, Print         Billy Fischer, MD 01/12/17 1008

## 2017-01-01 ENCOUNTER — Emergency Department (HOSPITAL_COMMUNITY): Payer: Medicaid Other

## 2017-01-01 ENCOUNTER — Emergency Department (HOSPITAL_COMMUNITY)
Admission: EM | Admit: 2017-01-01 | Discharge: 2017-01-01 | Disposition: A | Discharge status: Home / Self Care | Payer: Medicaid Other | Attending: Emergency Medicine | Admitting: Emergency Medicine

## 2017-01-01 ENCOUNTER — Encounter (HOSPITAL_COMMUNITY): Payer: Self-pay | Admitting: Emergency Medicine

## 2017-01-01 DIAGNOSIS — F17210 Nicotine dependence, cigarettes, uncomplicated: Secondary | ICD-10-CM | POA: Insufficient documentation

## 2017-01-01 DIAGNOSIS — M5442 Lumbago with sciatica, left side: Secondary | ICD-10-CM | POA: Insufficient documentation

## 2017-01-01 DIAGNOSIS — Z79899 Other long term (current) drug therapy: Secondary | ICD-10-CM | POA: Diagnosis not present

## 2017-01-01 DIAGNOSIS — I10 Essential (primary) hypertension: Secondary | ICD-10-CM | POA: Diagnosis not present

## 2017-01-01 DIAGNOSIS — Z7982 Long term (current) use of aspirin: Secondary | ICD-10-CM | POA: Insufficient documentation

## 2017-01-01 DIAGNOSIS — M545 Low back pain: Secondary | ICD-10-CM | POA: Diagnosis present

## 2017-01-01 LAB — URINALYSIS, ROUTINE W REFLEX MICROSCOPIC
BACTERIA UA: NONE SEEN
BILIRUBIN URINE: NEGATIVE
Glucose, UA: 150 mg/dL — AB
HGB URINE DIPSTICK: NEGATIVE
Ketones, ur: NEGATIVE mg/dL
NITRITE: NEGATIVE
Protein, ur: NEGATIVE mg/dL
SPECIFIC GRAVITY, URINE: 1.019 (ref 1.005–1.030)
pH: 5 (ref 5.0–8.0)

## 2017-01-01 MED ORDER — OXYCODONE-ACETAMINOPHEN 5-325 MG PO TABS
1.0000 | ORAL_TABLET | Freq: Once | ORAL | Status: AC
  Administered 2017-01-01: 1 via ORAL
  Filled 2017-01-01: qty 1

## 2017-01-01 MED ORDER — CYCLOBENZAPRINE HCL 10 MG PO TABS
10.0000 mg | ORAL_TABLET | Freq: Once | ORAL | Status: AC
  Administered 2017-01-01: 10 mg via ORAL
  Filled 2017-01-01: qty 1

## 2017-01-01 MED ORDER — OXYCODONE-ACETAMINOPHEN 5-325 MG PO TABS: 1.0000 | ORAL_TABLET | Freq: Four times a day (QID) | ORAL | 0 refills | Status: DC | PRN

## 2017-01-01 MED ORDER — CYCLOBENZAPRINE HCL 10 MG PO TABS
10.0000 mg | ORAL_TABLET | Freq: Two times a day (BID) | ORAL | 0 refills | Status: AC | PRN
Start: 2017-01-01 — End: ?

## 2017-01-01 NOTE — ED Provider Notes (Signed)
[facility] DEPT Provider Note   CSN: [id number] Arrival date & time: 01/01/17  1358   By signing my name below, I, [name], attest that this documentation has been prepared under the direction and in the presence of Sharon Paris, MD  Electronically Signed: Delton Murray, ED Scribe. 01/01/17. 4:02 PM.   History   Chief Complaint Chief Complaint  Patient presents with  . Tailbone Pain   The history is provided by the patient. No language interpreter was used.   HPI Comments:  [name] is a 51 y.o. female, with a hx of back pain and HTN, who presents to the Emergency Department complaining of intermittent, "10/10" lower left sided back pain which radiates down her entire left leg x 2 weeks. Her pain is worse with movement of her leg. Pt states her back gave out when she was getting off a toilet. She also reports abnormal bowel movements and mild lower abdominal discomfort. She has been taking BCs and muscle relaxers with no relief. Pt denies any recent injuries, recent falls, recent MVCs, constipation, diarrhea, any urinary symptoms, fevers, chills, numbness and a cough. No drug allergies noted.  PT does report some urinary frequency .   Past Medical History:  Diagnosis Date  . Hearing difficulty   . Hypertension   . Incontinence of urine 11/01/2011  . Menopausal syndrome 11/01/2011  . Thyroid disease    overactive- no meds    Patient Active Problem List   Diagnosis Date Noted  . Incontinence of urine 11/01/2011  . Menopausal syndrome 11/01/2011    Past Surgical History:  Procedure Laterality Date  . ENDOMETRIAL ABLATION W/ NOVASURE  10/11/2010  . TONSILLECTOMY AND ADENOIDECTOMY  age 87  . TUBAL LIGATION  2003  . WISDOM TOOTH EXTRACTION  1987    OB History    Gravida Para Term Preterm AB Living   3 2 2   1 2    SAB TAB Ectopic Multiple Live Births   1               Home Medications    Prior to Admission medications   Medication Sig Start  Date End Date Taking? Authorizing Provider  Aspirin-Salicylamide-Caffeine (BC FAST PAIN RELIEF) [phone] MG PACK Take 1 Package by mouth daily as needed (PAIN).   Yes Historical Provider, MD  INVOKAMET 50-500 MG TABS Take 1 tablet by mouth 2 (two) times daily. 11/14/16  Yes Historical Provider, MD  lisinopril-hydrochlorothiazide (PRINZIDE,ZESTORETIC) 10-12.5 MG per tablet Take 1 tablet by mouth daily.   Yes Historical Provider, MD    Family History Family History  Problem Relation Age of Onset  . Cancer Mother     lung  . Diabetes Mother   . Early death Mother     age 31  . Hyperlipidemia Father   . Hypertension Father   . Cancer Sister     uterine- hyaterectomy    Social History Social History  Substance Use Topics  . Smoking status: Current Every Day Smoker    Packs/day: 0.25    Years: 24.00    Types: Cigarettes  . Smokeless tobacco: Not on file  . Alcohol use Yes     Comment: socially     Allergies   Patient has no known allergies.   Review of Systems Review of Systems  Constitutional: Negative for activity change, chills, diaphoresis, fatigue and fever.  HENT: Negative for congestion and rhinorrhea.   Eyes: Negative for visual disturbance.  Respiratory: Negative for cough, chest  tightness, shortness of breath and stridor.   Cardiovascular: Negative for chest pain, palpitations and leg swelling.  Gastrointestinal: Positive for abdominal pain. Negative for abdominal distention, constipation, diarrhea, nausea and vomiting.  Genitourinary: Negative for difficulty urinating, dysuria, flank pain, frequency, hematuria, menstrual problem, pelvic pain, vaginal bleeding and vaginal discharge.  Musculoskeletal: Positive for back pain and myalgias. Negative for neck pain.  Skin: Negative for rash and wound.  Neurological: Negative for dizziness, weakness, light-headedness, numbness and headaches.  Psychiatric/Behavioral: Negative for agitation and confusion.  All other  systems reviewed and are negative.    Physical Exam Updated Vital Signs BP (!) 121/102 (BP Location: Right Arm)   Pulse 88   Temp 97.6 F (36.4 C) (Oral)   Resp 18   SpO2 99%   Physical Exam  Constitutional: She is oriented to person, place, and time. She appears well-developed and well-nourished. No distress.  HENT:  Head: Normocephalic and atraumatic.  Right Ear: External ear normal.  Left Ear: External ear normal.  Nose: Nose normal.  Mouth/Throat: Oropharynx is clear and moist. No oropharyngeal exudate.  Eyes: Conjunctivae and EOM are normal. Pupils are equal, round, and reactive to light.  Neck: Normal range of motion. Neck supple.  Pulmonary/Chest: No stridor. No respiratory distress.  Abdominal: Soft. She exhibits no distension. There is no tenderness. There is no rebound.  Musculoskeletal: She exhibits tenderness.  Midline spine tenderness (right>left). Positive straight leg on left. Normal strength and sensation intact.   Neurological: She is alert and oriented to person, place, and time. She has normal reflexes. She exhibits normal muscle tone. Coordination normal.  Skin: Skin is warm. No rash noted. She is not diaphoretic. No erythema.  Nursing note and vitals reviewed.    ED Treatments / Results  DIAGNOSTIC STUDIES:  Oxygen Saturation is 99% on RA, normal by my interpretation.    COORDINATION OF CARE:  3:59 PM Discussed treatment plan with pt at bedside and pt agreed to plan.  Labs (all labs ordered are listed, but only abnormal results are displayed) Labs Reviewed  URINALYSIS, ROUTINE W REFLEX MICROSCOPIC - Abnormal; Notable for the following:       Result Value   Glucose, UA 150 (*)    Leukocytes, UA TRACE (*)    Squamous Epithelial / LPF 0-5 (*)    All other components within normal limits  URINE CULTURE    EKG  EKG Interpretation None       Radiology Ct Lumbar Spine Wo Contrast  Result Date: 01/01/2017 CLINICAL DATA:  51 year old with  low back pain of on reported duration. Pain shooting into the left leg. No known injury. EXAM: CT LUMBAR SPINE WITHOUT CONTRAST TECHNIQUE: Multidetector CT imaging of the lumbar spine was performed without intravenous contrast administration. Multiplanar CT image reconstructions were also generated. COMPARISON:  None. FINDINGS: Segmentation: 5 lumbar type vertebral bodies. The last open disc space is L5-S1 Alignment: Mild straightening. No focal angulation or significant listhesis. Vertebrae: No evidence of acute fracture or pars defect. The lumbar pedicles are diffusely short on a congenital basis. There are mild sacroiliac degenerative changes bilaterally. Paraspinal and other soft tissues: Mild aortic atherosclerosis. Disc levels: T11-12: Normal interspace. T12-L1: Disc height is maintained. There is a left foraminal disc protrusion which may contribute to left T12 nerve root encroachment. The right foramen is patent. L1-2: Loss of disc height with annular disc bulging and endplate osteophytes asymmetric to the left. Mild facet and ligamentous hypertrophy. Mild narrowing of the left lateral recess. No significant  foraminal compromise. L2-3: Loss of disc height with annular disc bulging and moderate facet and ligamentous hypertrophy. Superimposed on congenital factors, there is resulting moderate multifactorial spinal stenosis. The foramina appear sufficiently patent. L3-4: Loss of disc height with annular disc bulging and vacuum phenomenon. Vacuum phenomenon extends into right L4 lateral recess. There is moderate facet and ligamentous hypertrophy. These factors contribute to moderate multifactorial spinal stenosis with narrowing of both lateral recesses. The foramina appear sufficiently patent. L4-5: Spondylosis is most advanced at this level where there is loss of disc height, annular disc bulging and endplate osteophytes contributing to mild left-greater-than-right foraminal narrowing. There is mild facet and  ligamentous hypertrophy, contributing to moderate multifactorial spinal stenosis and narrowing of both lateral recesses. L5-S1: Disc height is relatively maintained. Left-greater-than-right facet hypertrophy without resulting spinal stenosis or nerve root encroachment. IMPRESSION: 1. Congenitally short pedicles. 2. Superimposed multilevel spondylosis, contributing to moderate multifactorial spinal stenosis from L2-3 through L4-5. There is associated narrowing of the lateral recesses, but only mild foraminal narrowing at L4-5. 3. Probable extruded disc material with vacuum phenomenon at L3-4, extending inferiorly into the right L4 lateral recess. No clear explanation for left lower extremity radicular symptoms. Electronically Signed   By: Richardean Sale M.D.   On: 01/01/2017 19:25   Dg Hip Unilat W Or Wo Pelvis 2-3 Views Left  Result Date: 01/01/2017 CLINICAL DATA:  Left side back pain, left leg pain for 2 weeks EXAM: DG HIP (WITH OR WITHOUT PELVIS) 2-3V LEFT COMPARISON:  None. FINDINGS: Three views of the left hip submitted. No acute fracture or subluxation. Mild degenerative changes bilateral SI joints and pubic symphysis. IMPRESSION: No acute fracture or subluxation.  Mild degenerative changes. Electronically Signed   By: Lahoma Crocker M.D.   On: 01/01/2017 16:56    Procedures Procedures (including critical care time)  Medications Ordered in ED Medications  oxyCODONE-acetaminophen (PERCOCET/ROXICET) 5-325 MG per tablet 1 tablet (1 tablet Oral Given 01/01/17 1622)  cyclobenzaprine (FLEXERIL) tablet 10 mg (10 mg Oral Given 01/01/17 1622)     Initial Impression / Assessment and Plan / ED Course  I have reviewed the triage vital signs and the nursing notes.  Pertinent labs & imaging results that were available during my care of the patient were reviewed by me and considered in my medical decision making (see chart for details).  Clinical Course     [name] is a 51 y.o. female, with a hx of  back pain and HTN, who presents to the Emergency Department complaining of left sided lower back pain and frequency.   History exam are seen above. Patient has tenderness across her low back worse on the side. Straight leg raise test is positive on the left. Patient has normal strength, sensation, reflexes, and pulses in her bilateral lower extremities. Abdomen is nontender. Tenderness on the left hip. Exam otherwise unremarkable. No lower extremity edema or erythema.  Based on patient's report of "a bad back all my life", feel that CT scan would better evaluate spine versus x-rays.  CT scan was ordered, pain medicine was given. X-rays obtained of pelvis and hip. Patient had improvement in pain with medications. Patient also had urinalysis to look for UTI frequency. Patient denied dysuria.  Urinalysis did not support tract infection at this time. CT scan showed No immediate cause of the left leg pain. There was evidence of degeneration and disc extrusion but radiology could not find the source of her discomfort. Suspect musculoskeletal pain and spasm leading to  radicular sciatic discomfort. X-ray showed no fracture or dislocation of the pelvis or hips.  As patient felt better, she was able to ambulate better. Patient informed she likely has a muscular spasm causing her discomfort. Patient given prescription for pain medication and muscle relaxant. Patient instructed to follow up with her PCP as well as possibly a spine physician. Do not feel patient has infection or abscess based on history and exam.  Patient given return precautions for signs and symptoms of cauda equina. No other red flags were found today. Patient understood return precautions and follow plans. Patient discharged in good condition.    Final Clinical Impressions(s) / ED Diagnoses   Final diagnoses:  Acute left-sided low back pain with left-sided sciatica    New Prescriptions Discharge Medication List as of 01/01/2017  7:45 PM      START taking these medications   Details  cyclobenzaprine (FLEXERIL) 10 MG tablet Take 1 tablet (10 mg total) by mouth 2 (two) times daily as needed for muscle spasms., Starting Mon 01/01/2017, Print    oxyCODONE-acetaminophen (PERCOCET/ROXICET) 5-325 MG tablet Take 1 tablet by mouth every 6 (six) hours as needed for severe pain., Starting Mon 01/01/2017, Print       I personally performed the services described in this documentation, which was scribed in my presence. The recorded information has been reviewed and is accurate.  Clinical Impression: 1. Acute left-sided low back pain with left-sided sciatica     Disposition: Discharge  Condition: Good  I have discussed the results, Dx and Tx plan with the pt(& family if present). He/she/they expressed understanding and agree(s) with the plan. Discharge instructions discussed at great length. Strict return precautions discussed and pt &/or family have verbalized understanding of the instructions. No further questions at time of discharge.    Discharge Medication List as of 01/01/2017  7:45 PM    START taking these medications   Details  cyclobenzaprine (FLEXERIL) 10 MG tablet Take 1 tablet (10 mg total) by mouth 2 (two) times daily as needed for muscle spasms., Starting Mon 01/01/2017, Print    oxyCODONE-acetaminophen (PERCOCET/ROXICET) 5-325 MG tablet Take 1 tablet by mouth every 6 (six) hours as needed for severe pain., Starting Mon 01/01/2017, Print        Follow Up: Nolene Ebbs, MD [place] [address] [phone]     [place] [id number] [place] [address] [phone]  If symptoms worsen      Sharon Paris, MD 01/02/17 [phone]

## 2017-01-01 NOTE — Discharge Instructions (Signed)
You likely have a muscle strain causing the pain in her back  That shoots down her left leg. Please take the pain medicine and muscle relaxant to help with your symptoms. Please follow-up with your primary doctor and consider seeing a spine doctor for further management. If any symptoms worsen such as weakness or loss of control or your bladder or bowels, please return to the nearest emergency department.

## 2017-01-01 NOTE — ED Notes (Addendum)
Patient transported to CT, per family member.

## 2017-01-01 NOTE — ED Triage Notes (Addendum)
Pt states she was seen by urgent care and was told back strain. Lower back pain on left side radiating down to buttocks and leg. States burning and tingling around hip bone. She cannot straighten up all the way and hurts worse when getting up from lying down. States she got a shot in her arm and muscle relaxer and helped but not for very long.

## 2017-01-02 LAB — URINE CULTURE

## 2017-05-17 IMAGING — CT CT L SPINE W/O CM
3 series · 9 of 33 positions shown, 11 images · non-contrast
Comparison: None.

CLINICAL DATA: 50-year-old with low back pain of on reported
duration. Pain shooting into the left leg. No known injury.

EXAM:
CT LUMBAR SPINE WITHOUT CONTRAST
TECHNIQUE: Multidetector CT imaging of the lumbar spine was performed without
intravenous contrast administration. Multiplanar CT image
reconstructions were also generated.

[Series 5: l spine 2.0 st · axial · 0.26mm/px · z∈[+52,+52]mm · 1 of 113 slices shown, 2 images]
[im 61/113  soft-tissue]
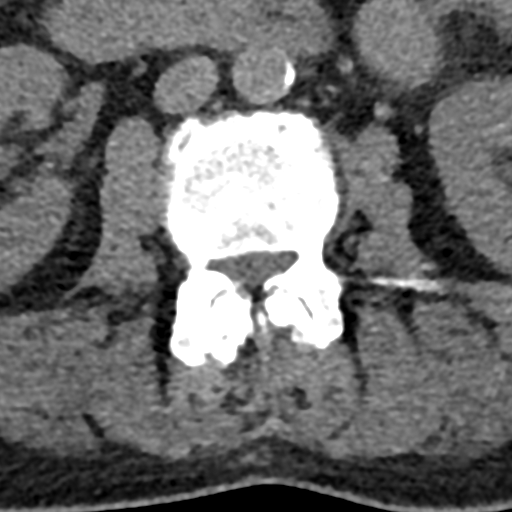
[im 61/113  bone]
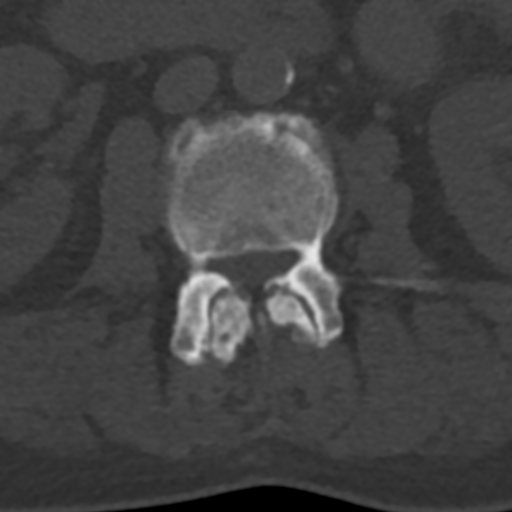

[Series 8: sagittal st · sagittal · 0.23mm/px · 5 of 66 slices shown, 6 images]
[im 22/66  bone]
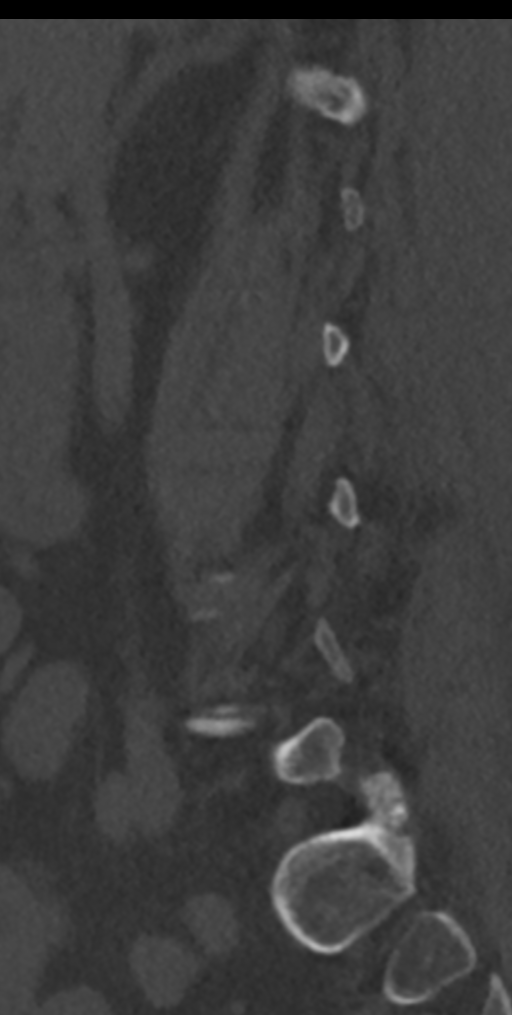
[im 28/66  bone]
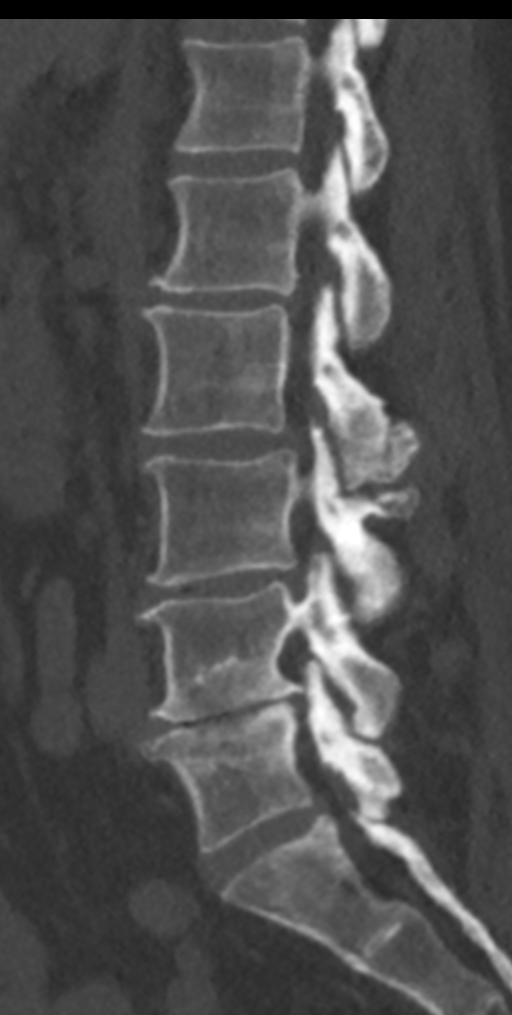
[im 33/66  soft-tissue]
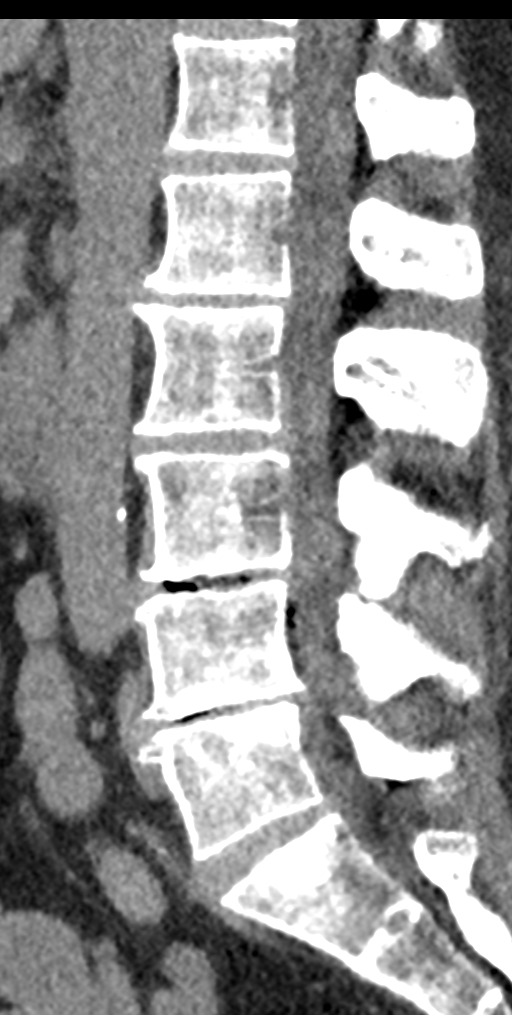
[im 33/66  bone]
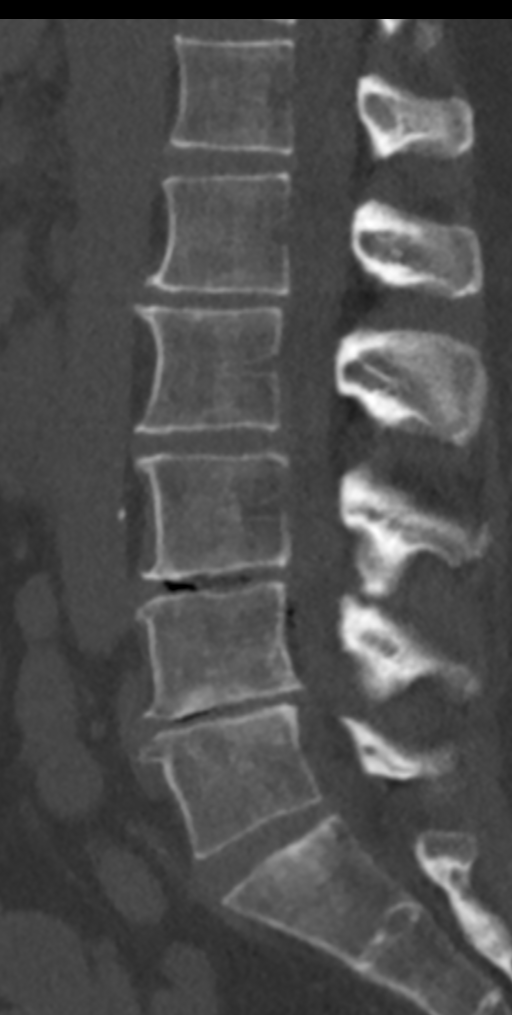
[im 38/66  bone]
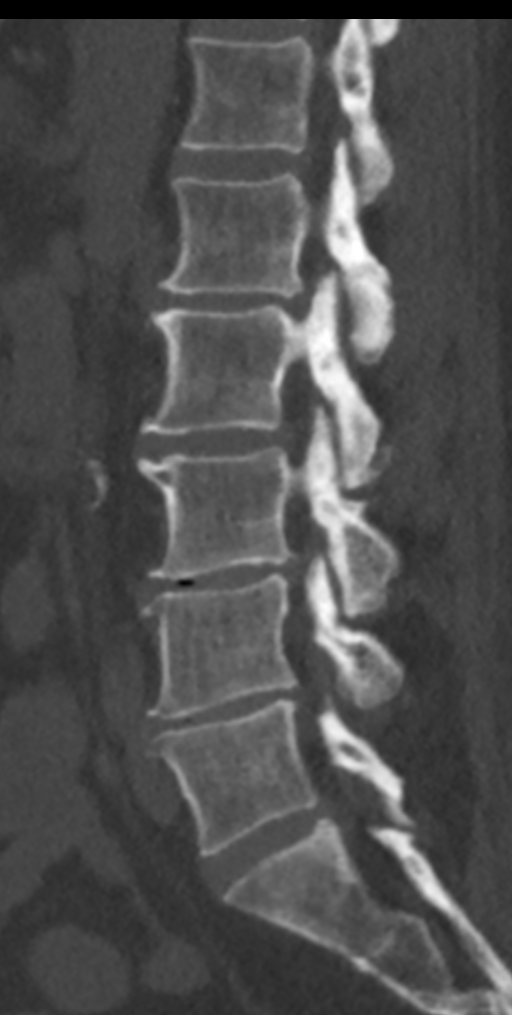
[im 44/66  bone]
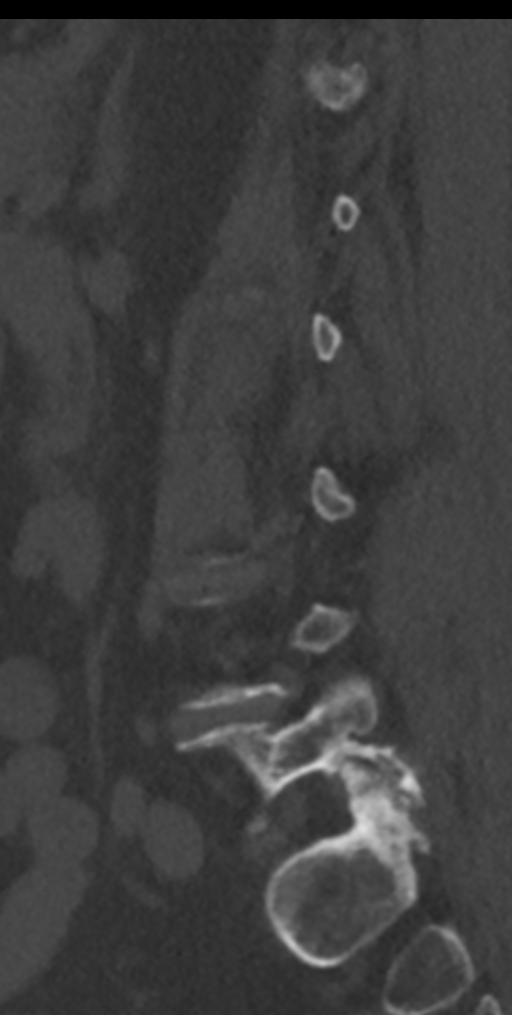

[Series 9: coronal st · coronal · 0.29mm/px · 3 of 61 slices shown]
[im 13/61  bone]
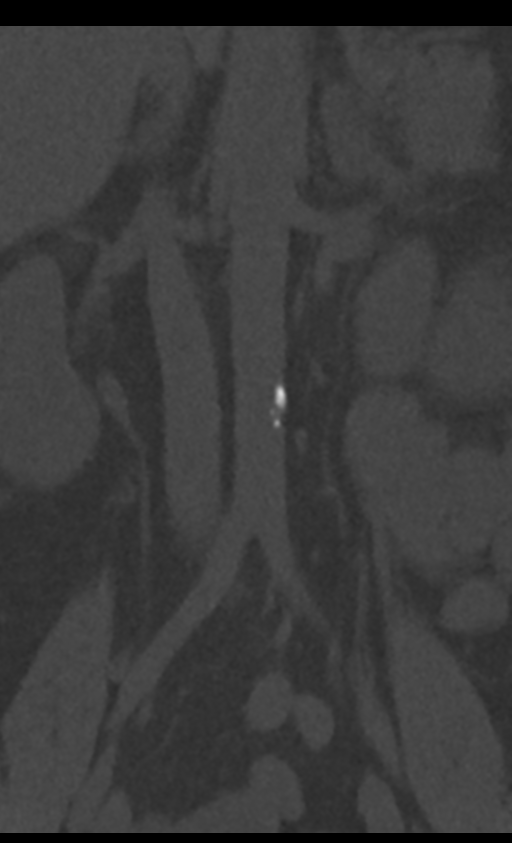
[im 25/61  bone]
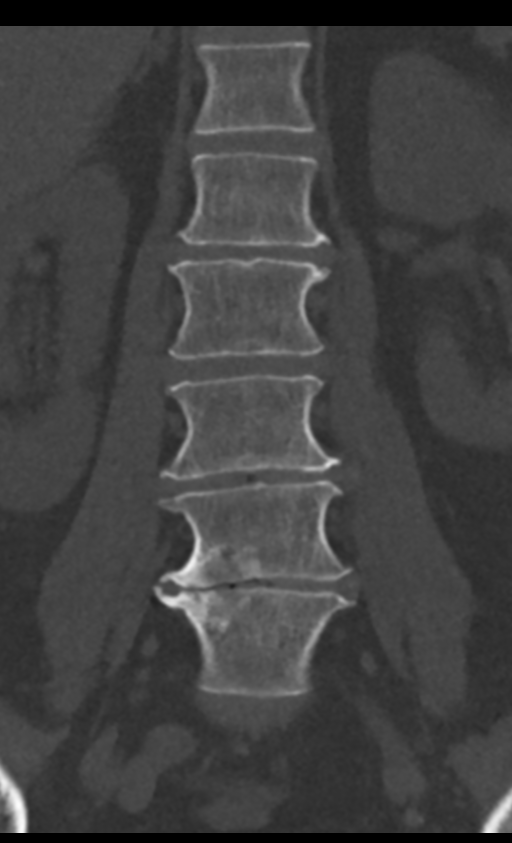
[im 37/61  bone]
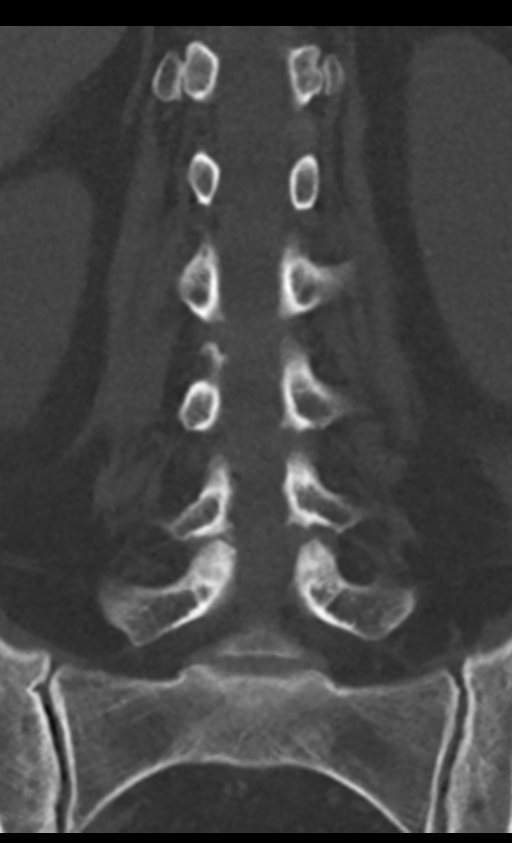

[9 of 33 positions shown; findings below may reference images not displayed]

FINDINGS: Segmentation: 5 lumbar type vertebral bodies. The last open disc
space is L5-S1

Alignment: Mild straightening. No focal angulation or significant
listhesis.

Vertebrae: No evidence of acute fracture or pars defect. The lumbar
pedicles are diffusely short on a congenital basis. There are mild
sacroiliac degenerative changes bilaterally.

Paraspinal and other soft tissues: Mild aortic atherosclerosis.

Disc levels:

T11-12: Normal interspace.

T12-L1: Disc height is maintained. There is a left foraminal disc
protrusion which may contribute to left T12 nerve root encroachment.
The right foramen is patent.

L1-2: Loss of disc height with annular disc bulging and endplate
osteophytes asymmetric to the left. Mild facet and ligamentous
hypertrophy. Mild narrowing of the left lateral recess. No
significant foraminal compromise.

L2-3: Loss of disc height with annular disc bulging and moderate
facet and ligamentous hypertrophy. Superimposed on congenital
factors, there is resulting moderate multifactorial spinal stenosis.
The foramina appear sufficiently patent.

L3-4: Loss of disc height with annular disc bulging and vacuum
phenomenon. Vacuum phenomenon extends into right L4 lateral recess.
There is moderate facet and ligamentous hypertrophy. These factors
contribute to moderate multifactorial spinal stenosis with narrowing
of both lateral recesses. The foramina appear sufficiently patent.

L4-5: Spondylosis is most advanced at this level where there is loss
of disc height, annular disc bulging and endplate osteophytes
contributing to mild left-greater-than-right foraminal narrowing.
There is mild facet and ligamentous hypertrophy, contributing to
moderate multifactorial spinal stenosis and narrowing of both
lateral recesses.

L5-S1: Disc height is relatively maintained. Left-greater-than-right
facet hypertrophy without resulting spinal stenosis or nerve root
encroachment.
IMPRESSION: 1. Congenitally short pedicles.
2. Superimposed multilevel spondylosis, contributing to moderate
multifactorial spinal stenosis from L2-3 through L4-5. There is
associated narrowing of the lateral recesses, but only mild
foraminal narrowing at L4-5.
3. Probable extruded disc material with vacuum phenomenon at L3-4,
extending inferiorly into the right L4 lateral recess. No clear
explanation for left lower extremity radicular symptoms.

## 2018-03-09 ENCOUNTER — Encounter (HOSPITAL_COMMUNITY): Payer: Self-pay | Admitting: Emergency Medicine

## 2018-03-09 ENCOUNTER — Emergency Department (HOSPITAL_COMMUNITY)
Admission: EM | Admit: 2018-03-09 | Discharge: 2018-03-09 | Disposition: A | Discharge status: Home / Self Care | Payer: Self-pay | Attending: Emergency Medicine | Admitting: Emergency Medicine

## 2018-03-09 DIAGNOSIS — Y998 Other external cause status: Secondary | ICD-10-CM | POA: Insufficient documentation

## 2018-03-09 DIAGNOSIS — Y929 Unspecified place or not applicable: Secondary | ICD-10-CM | POA: Insufficient documentation

## 2018-03-09 DIAGNOSIS — Z79899 Other long term (current) drug therapy: Secondary | ICD-10-CM | POA: Insufficient documentation

## 2018-03-09 DIAGNOSIS — G44319 Acute post-traumatic headache, not intractable: Secondary | ICD-10-CM | POA: Insufficient documentation

## 2018-03-09 DIAGNOSIS — F17210 Nicotine dependence, cigarettes, uncomplicated: Secondary | ICD-10-CM | POA: Insufficient documentation

## 2018-03-09 DIAGNOSIS — S0990XA Unspecified injury of head, initial encounter: Secondary | ICD-10-CM | POA: Insufficient documentation

## 2018-03-09 DIAGNOSIS — S0181XA Laceration without foreign body of other part of head, initial encounter: Secondary | ICD-10-CM

## 2018-03-09 DIAGNOSIS — E119 Type 2 diabetes mellitus without complications: Secondary | ICD-10-CM | POA: Insufficient documentation

## 2018-03-09 DIAGNOSIS — Z7984 Long term (current) use of oral hypoglycemic drugs: Secondary | ICD-10-CM | POA: Insufficient documentation

## 2018-03-09 DIAGNOSIS — Y9389 Activity, other specified: Secondary | ICD-10-CM | POA: Insufficient documentation

## 2018-03-09 DIAGNOSIS — I10 Essential (primary) hypertension: Secondary | ICD-10-CM | POA: Insufficient documentation

## 2018-03-09 HISTORY — DX: Type 2 diabetes mellitus without complications: E11.9

## 2018-03-09 MED ORDER — LIDOCAINE-EPINEPHRINE (PF) 2 %-1:200000 IJ SOLN
20.0000 mL | Freq: Once | INTRAMUSCULAR | Status: AC
  Administered 2018-03-09: 20 mL
  Filled 2018-03-09: qty 20

## 2018-03-09 MED ORDER — HYDROCODONE-ACETAMINOPHEN 5-325 MG PO TABS
2.0000 | ORAL_TABLET | Freq: Once | ORAL | Status: AC
  Administered 2018-03-09: 2 via ORAL
  Filled 2018-03-09: qty 2

## 2018-03-09 MED ORDER — CEPHALEXIN 500 MG PO CAPS: 500.0000 mg | ORAL_CAPSULE | Freq: Four times a day (QID) | ORAL | 0 refills | Status: DC
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
## 2018-03-09 NOTE — Discharge Instructions (Addendum)
1. Medications: Tylenol or ibuprofen for pain, usual home medications 2. Treatment: ice for swelling, keep wound clean with warm soap and water and keep bandage dry, do not submerge in water for 24 hours 3. Follow Up: Return to the emergency department for increased redness, drainage of pus from the wound.  Please also return if you develop weakness, dizziness, tingling, vision changes, seizures, confusion or vomiting.   WOUND CARE  Keep area clean and dry for 24 hours. Do not remove bandage, if applied.  After 24 hours, remove bandage and wash wound gently with mild soap and warm water. Reapply a new bandage after cleaning wound, if directed.   Continue daily cleansing with soap and water until stitches/staples are removed.  Do not apply any ointments or creams to the wound while stitches/staples are in place, as this may cause delayed healing. Return if you experience any of the following signs of infection: Swelling, redness, pus drainage, streaking, fever >101.0 F  Return if you experience excessive bleeding that does not stop after 15-20 minutes of constant, firm pressure.

## 2021-01-20 ENCOUNTER — Telehealth: Payer: Self-pay | Admitting: Hematology and Oncology

## 2021-01-20 NOTE — Telephone Encounter (Signed)
Received a new hem referral from Dr. Kristie Cowman for polycythemia. [name] has been cld and scheduled to see [name] on 2/22 at 415pm. Pt aware to arrive 20 minutes early.

## 2021-02-07 DIAGNOSIS — D751 Secondary polycythemia: Secondary | ICD-10-CM | POA: Insufficient documentation

## 2021-02-07 DIAGNOSIS — D45 Polycythemia vera: Secondary | ICD-10-CM | POA: Insufficient documentation

## 2021-02-07 NOTE — Assessment & Plan Note (Deleted)
Lab review:  Hemoglobin 15, hematocrit 43.7, platelets 347, WBC 9.2, differential normal 06/08/2010: Hemoglobin 15.1, WBC 13.4  Overall her hemoglobin has not changed over the past 10 years.  I discussed with the patient extensively the differential diagnosis of polycythemia 1. Primary polycythemia due to clonal stem cell abnormality 2. Secondary polycythemia due to cause that include hypoxia, (heart or lung problems, altitude, athletics, obstructive sleep apnea), erythropoietin producing lesion/tumors, medications etc  Recommendation: 1. JAK-2 mutation testing to evaluate polycythemia vera 2. Erythropoietin level   Indications for phlebotomy 1. Primary polycythemia with hematocrit over 55 2. Secondary polycythemia with severe symptoms which include strokelike symptoms, severe recurrent headaches, severe fatigue.  Patient does not have any clear-cut symptoms that would require phlebotomy at this time. If the JAK-2 mutation is normal and erythropoietin level is normal, a bone marrow biopsy may be considered. If the erythropoietin is elevated, he will need ultrasound of the liver and kidney for further evaluation.   Follow-up next week to discuss results of the tests with a MyChart virtual visit   

## 2021-02-07 NOTE — Progress Notes (Incomplete)
[facility] CONSULT NOTE  Patient Care Team: Nolene Ebbs, MD as PCP - General (Internal Medicine)  CHIEF COMPLAINTS/PURPOSE OF CONSULTATION:  Newly diagnosed polycythemia  HISTORY OF PRESENTING ILLNESS:  [name] 55 y.o. female is here because of recent diagnosis polycythemia. She is referred by Dr. Kristie Cowman at [facility]. Labs on 12/16/20 showed Hg 15.0, RBC 4.51, HCT 43.7, MCV 97, platelets 347. She presents to the clinic today for initial evaluation.  I reviewed her records extensively and collaborated the history with the patient.  MEDICAL HISTORY:  Past Medical History:  Diagnosis Date  . Diabetes mellitus without complication ([place])   . Hearing difficulty   . Hypertension   . Incontinence of urine 11/01/2011  . Menopausal syndrome 11/01/2011  . Thyroid disease    overactive- no meds    SURGICAL HISTORY: Past Surgical History:  Procedure Laterality Date  . ENDOMETRIAL ABLATION W/ NOVASURE  10/11/2010  . TONSILLECTOMY AND ADENOIDECTOMY  age 89  . TUBAL LIGATION  2003  . WISDOM TOOTH EXTRACTION  1987    SOCIAL HISTORY: Social History   Socioeconomic History  . Marital status: Married    Spouse name: Not on file  . Number of children: Not on file  . Years of education: Not on file  . Highest education level: Not on file  Occupational History  . Not on file  Tobacco Use  . Smoking status: Current Every Day Smoker    Packs/day: 0.25    Years: 24.00    Pack years: 6.00    Types: Cigarettes  . Smokeless tobacco: Never Used  Substance and Sexual Activity  . Alcohol use: Yes    Comment: socially  . Drug use: No  . Sexual activity: Never    Birth control/protection: Surgical  Other Topics Concern  . Not on file  Social History Narrative  . Not on file   Social Determinants of Health   Financial Resource Strain: Not on file  Food Insecurity: Not on file  Transportation Needs: Not on file  Physical Activity: Not on  file  Stress: Not on file  Social Connections: Not on file  Intimate Partner Violence: Not on file    FAMILY HISTORY: Family History  Problem Relation Age of Onset  . Cancer Mother        lung  . Diabetes Mother   . Early death Mother        age 21  . Hyperlipidemia Father   . Hypertension Father   . Cancer Sister        uterine- hyaterectomy    ALLERGIES:  has No Known Allergies.  MEDICATIONS:  Current Outpatient Medications  Medication Sig Dispense Refill  . Aspirin-Salicylamide-Caffeine (BC FAST PAIN RELIEF) 650-195-33.3 MG PACK Take 1 Package by mouth daily as needed (PAIN).    . cephALEXin (KEFLEX) 500 MG capsule Take 1 capsule (500 mg total) by mouth 4 (four) times daily. 40 capsule 0  . cyclobenzaprine (FLEXERIL) 10 MG tablet Take 1 tablet (10 mg total) by mouth 2 (two) times daily as needed for muscle spasms. 20 tablet 0  . INVOKAMET 50-500 MG TABS Take 1 tablet by mouth 2 (two) times daily.  5  . lisinopril-hydrochlorothiazide (PRINZIDE,ZESTORETIC) 10-12.5 MG per tablet Take 1 tablet by mouth daily.    Marland Kitchen oxyCODONE-acetaminophen (PERCOCET/ROXICET) 5-325 MG tablet Take 1 tablet by mouth every 6 (six) hours as needed for severe pain. 15 tablet 0   No current facility-administered medications for this visit.  REVIEW OF SYSTEMS:   Constitutional: Denies fevers, chills or abnormal night sweats Eyes: Denies blurriness of vision, double vision or watery eyes Ears, nose, mouth, throat, and face: Denies mucositis or sore throat Respiratory: Denies cough, dyspnea or wheezes Cardiovascular: Denies palpitation, chest discomfort or lower extremity swelling Gastrointestinal:  Denies nausea, heartburn or change in bowel habits Skin: Denies abnormal skin rashes Lymphatics: Denies new lymphadenopathy or easy bruising Neurological:Denies numbness, tingling or new weaknesses Behavioral/Psych: Mood is stable, no new changes  All other systems were reviewed with the patient and are  negative.  PHYSICAL EXAMINATION: ECOG PERFORMANCE STATUS: {CHL ONC ECOG [phone]}  There were no vitals filed for this visit. There were no vitals filed for this visit.  GENERAL:alert, no distress and comfortable SKIN: skin color, texture, turgor are normal, no rashes or significant lesions EYES: normal, conjunctiva are pink and non-injected, sclera clear OROPHARYNX:no exudate, no erythema and lips, buccal mucosa, and tongue normal  NECK: supple, thyroid normal size, non-tender, without nodularity LYMPH:  no palpable lymphadenopathy in the cervical, axillary or inguinal LUNGS: clear to auscultation and percussion with normal breathing effort HEART: regular rate & rhythm and no murmurs and no lower extremity edema ABDOMEN:abdomen soft, non-tender and normal bowel sounds Musculoskeletal:no cyanosis of digits and no clubbing  PSYCH: alert & oriented x 3 with fluent speech NEURO: no focal motor/sensory deficits  LABORATORY DATA:  I have reviewed the data as listed Lab Results  Component Value Date   WBC 11.1 (H) 10/04/2010   HGB 14.7 10/04/2010   HCT 42.7 10/04/2010   MCV 92.2 10/04/2010   PLT 318 10/04/2010   Lab Results  Component Value Date   NA 138 08/10/2010   K 4.7 08/10/2010   CL 106 08/10/2010   CO2 21 08/10/2010    RADIOGRAPHIC STUDIES: I have personally reviewed the radiological reports and agreed with the findings in the report.  ASSESSMENT AND PLAN:  No problem-specific Assessment & Plan notes found for this encounter.   All questions were answered. The patient knows to call the clinic with any problems, questions or concerns.   Rulon Eisenmenger, MD, MPH 02/07/2021    I, Molly Dorshimer, am acting as scribe for Nicholas Lose, MD.  {Add scribe attestation statement}

## 2021-02-08 ENCOUNTER — Telehealth: Payer: Self-pay | Admitting: Nurse Practitioner

## 2021-02-08 ENCOUNTER — Inpatient Hospital Stay: Payer: Medicaid Other | Admitting: Hematology and Oncology

## 2021-02-08 DIAGNOSIS — D45 Polycythemia vera: Secondary | ICD-10-CM

## 2021-02-08 NOTE — Telephone Encounter (Signed)
Received a call from Ms. Breunig to reschedule her new hem referral to 2/28 at 11am to see Lacie. Pt aware to arrive 20 minutes early.

## 2021-02-13 NOTE — Progress Notes (Unsigned)
[place]  [phone] [phone]  Clinic New consult Note   Patient Care Team: Nolene Ebbs, MD as PCP - General (Internal Medicine) Kristie Cowman, MD as Referring Physician (Family Medicine) 02/14/2021  CHIEF COMPLAINTS/PURPOSE OF CONSULTATION:  Polycythemia, referred by PCP Dr. Kristie Cowman  HISTORY OF PRESENTING ILLNESS:  [name] 55 y.o. female with medical history including DM, HTN, HL, anxiety/depression, and smoking use is here for elevated hemoglobin. Remote CBC in 2011 showed WBC 11.1 - 13.4 range and Hg 14.7 - 15.1.  She is not aware of a previous diagnosis of polycythemia.  Labs on 12/27/2020 per PCP showed normal WBC 9.2, RBC 4.51, Hgb 15.0, HCT 43.7%, no cytopenias, and an unremarkable CMP.  She reports a 27-year smoking history but is cutting back, now smokes half pack per day.  Never had a sleep study for OSA.  She took low-dose HRT during early menopause but none in several years.  She is fatigued so not very active.  Denies history of DVT/thrombosis.  She eats a normal diet, does not drink a lot of water.  She is not on oral iron replacement.  She is up-to-date on age-appropriate cancer screenings, all normal per patient.  Socially she lives with her son, daughter, and her boyfriend.  Her 2 children are healthy, her husband died 3 years ago.  She is a Secretary/administrator at [employer].  She is independent with ADLs but never learned to drive due to anxiety behind the wheel.  She drinks 1-2 glasses of wine most nights, denies drug use.  She denies any known family history of blood conditions.  Her mother had lung cancer, father had stomach cancer he died 45 weeks ago from complications from [id number], a maternal aunt had breast cancer.  Today she presents with a family friend.  She is chronically fatigued but worse this year.  Some situational anxiety/depression but mood is stable.  She has a chronic cough and exertional dyspnea from  smoking. Arthritic pain , no chest pain, headache, dizziness, fever, chills, bleeding.  She has arthritis pain in her hands and back.   MEDICAL HISTORY:  Past Medical History:  Diagnosis Date  . Diabetes mellitus without complication ([place])   . Hearing difficulty   . Hypertension   . Incontinence of urine 11/01/2011  . Menopausal syndrome 11/01/2011  . Thyroid disease    overactive- no meds    SURGICAL HISTORY: Past Surgical History:  Procedure Laterality Date  . ENDOMETRIAL ABLATION W/ NOVASURE  10/11/2010  . TONSILLECTOMY AND ADENOIDECTOMY  age 34  . TUBAL LIGATION  2003  . WISDOM TOOTH EXTRACTION  1987    SOCIAL HISTORY: Social History   Socioeconomic History  . Marital status: Married    Spouse name: Not on file  . Number of children: Not on file  . Years of education: Not on file  . Highest education level: Not on file  Occupational History  . Not on file  Tobacco Use  . Smoking status: Current Every Day Smoker    Packs/day: 0.25    Years: 24.00    Pack years: 6.00    Types: Cigarettes  . Smokeless tobacco: Never Used  Substance and Sexual Activity  . Alcohol use: Yes    Comment: socially  . Drug use: No  . Sexual activity: Never    Birth control/protection: Surgical  Other Topics Concern  . Not on file  Social History Narrative  . Not on file   Social Determinants  of Health   Financial Resource Strain: Not on file  Food Insecurity: Not on file  Transportation Needs: Not on file  Physical Activity: Not on file  Stress: Not on file  Social Connections: Not on file  Intimate Partner Violence: Not on file    FAMILY HISTORY: Family History  Problem Relation Age of Onset  . Cancer Mother        lung  . Diabetes Mother   . Early death Mother        age 41  . Hyperlipidemia Father   . Hypertension Father   . Cancer Sister        uterine- hyaterectomy    ALLERGIES:  has No Known Allergies.  MEDICATIONS:  Current Outpatient Medications   Medication Sig Dispense Refill  . Aspirin-Salicylamide-Caffeine (BC FAST PAIN RELIEF) 650-195-33.3 MG PACK Take 1 Package by mouth daily as needed (PAIN).    Marland Kitchen cyclobenzaprine (FLEXERIL) 10 MG tablet Take 1 tablet (10 mg total) by mouth 2 (two) times daily as needed for muscle spasms. 20 tablet 0  . INVOKAMET 50-500 MG TABS Take 1 tablet by mouth 2 (two) times daily.  5  . lisinopril-hydrochlorothiazide (PRINZIDE,ZESTORETIC) 10-12.5 MG per tablet Take 1 tablet by mouth daily.    Marland Kitchen oxyCODONE-acetaminophen (PERCOCET/ROXICET) 5-325 MG tablet Take 1 tablet by mouth every 6 (six) hours as needed for severe pain. 15 tablet 0   No current facility-administered medications for this visit.    REVIEW OF SYSTEMS:   Constitutional: Denies fevers, chills or abnormal night sweats (+) fatigue Eyes: Denies blurriness of vision, double vision or watery eyes Ears, nose, mouth, throat, and face: Denies mucositis or sore throat Respiratory: Denies dyspnea or wheezes (+) chronic cough (+) exertional dyspnea Cardiovascular: Denies palpitation, chest discomfort or lower extremity swelling Gastrointestinal:  Denies nausea, vomiting, constipation, diarrhea, hematochezia/melena, heartburn or change in bowel habits Skin: Denies abnormal skin rashes Lymphatics: Denies new lymphadenopathy or easy bruising Neurological:Denies numbness, tingling or new weaknesses Behavioral/Psych: Mood is stable, no new changes (+) situational anxiety/depression All other systems were reviewed with the patient and are negative.  PHYSICAL EXAMINATION: ECOG PERFORMANCE STATUS: 0 - Asymptomatic  Vitals:   02/14/21 1035  BP: 138/89  Pulse: 72  Resp: 15  Temp: 97.7 F (36.5 C)  SpO2: 100%   Filed Weights   02/14/21 1035  Weight: 145 lb 4.8 oz (65.9 kg)    GENERAL:alert, no distress and comfortable SKIN: No rash  EYES:  sclera clear NECK: Without mass LYMPH:  no palpable cervical or supraclavicular lymphadenopathy  LUNGS:  Diminished, with normal breathing effort HEART: regular rate & rhythm, trace bilateral lower extremity edema ABDOMEN:abdomen soft, non-tender and normal bowel sounds PSYCH: alert & oriented x 3 with fluent speech NEURO: no focal motor/sensory deficits  LABORATORY DATA:  I have reviewed the data as listed CBC Latest Ref Rng & Units 02/14/2021 10/04/2010 06/08/2010  WBC 4.0 - 10.5 K/uL 7.5 11.1(H) 13.4(H)  Hemoglobin 12.0 - 15.0 g/dL 14.9 14.7 15.1(H)  Hematocrit 36.0 - 46.0 % 43.7 42.7 42.5  Platelets 150 - 400 K/uL 292 318 321     RADIOGRAPHIC STUDIES: I have personally reviewed the radiological images as listed and agreed with the findings in the report. No results found.  ASSESSMENT & PLAN: 55 year old female  1.  History of polycythemia -We reviewed her historical and outside records in detail with the patient and her support person.  She has a remote history of mild elevation hemoglobin to 15.1 in 2011, normal  RBC and HCT.  Recent labs at PCP 12/27/20 are WNL.  -We reviewed the potential causes of elevated hemoglobin/polycythemia, including primary bone marrow disorder and secondary causes including but not limited to dehydration, smoking, hormonal, and malignancy -Due to the mild elevation and chronicity, this is not likely MPN. We are not recommending NGS/Jak 2 testing -She is a current smoker but trying to quit. No current history of hormone use.  She is up-to-date on age-appropriate cancer screenings. -We discussed her remote history is likely secondary to dehydration and smoking.  We encouraged her to hydrate and quit smoking. She is motivated. She has not had a sleep study to rule out OSA. -Repeat CBC today is completely normal, smear shows normal RBC morphology.  -This can be monitored by her PCP, we will see her as needed in the future, if she has recurrent polycythemia and/or other heme/onc concerns.  2.  Smoking cessation -began smoking at age 45, down to 1/2 PPD. She is  interested in quitting.  -we recommend nicotine replacement patch/gum. If not successful, can discuss alternating pharmacological agents with PCP  3.  Health maintenance and disease prevention -we discussed certain malignancies can cause abnormal CBC. She is reportedly UTD on pap smear, mammogram and colonoscopy.  -mammogram screening program through her employer  -continue smoking cessation, hydration, alcohol modification, physical exercise, and healthy lifestyle   PLAN: -outside record and today's labs reviewed -CBC WNL, no further heme work up recommended  -continue to follow with PCP -f/up with [place] open, will see her if needed in the future  -CC note to Dr. Kristie Cowman PCP  Orders Placed This Encounter  Procedures  . CBC with Differential (Cancer Center Only)    Standing Status:   Future    Number of Occurrences:   1    Standing Expiration Date:   02/14/2022  . Save Smear (SSMR)    Standing Status:   Future    Number of Occurrences:   1    Standing Expiration Date:   02/14/2022  . Technologist smear review    Standing Status:   Future    Number of Occurrences:   1    Standing Expiration Date:   02/14/2022   All questions were answered. The patient knows to call the clinic with any problems, questions or concerns.     Alla Feeling, NP 02/14/21   Addendum  I have seen the patient, examined her. I agree with the assessment and and plan and have edited the notes.   [name] was referred for polycythemia.  I have reviewed her outside lab, her highest Hg was 15.1, HCT 44%. She technically does not meet criteria for polycythemia.  We will repeat her CBC and review her peripheral smear today.  If no evidence of polycythemia, no need for further workup or follow-up with [place].  We discussed smoking cessation, she is interested.  Truitt Merle  02/14/2021

## 2021-02-14 ENCOUNTER — Inpatient Hospital Stay: Payer: No Typology Code available for payment source

## 2021-02-14 ENCOUNTER — Other Ambulatory Visit: Payer: Self-pay

## 2021-02-14 ENCOUNTER — Inpatient Hospital Stay
Payer: No Typology Code available for payment source | Attending: Hematology and Oncology | Admitting: Nurse Practitioner

## 2021-02-14 ENCOUNTER — Encounter: Payer: Self-pay | Admitting: Nurse Practitioner

## 2021-02-14 VITALS — BP 138/89 | HR 72 | Temp 97.7°F | Resp 15 | Ht 65.0 in | Wt 145.3 lb

## 2021-02-14 DIAGNOSIS — Z79899 Other long term (current) drug therapy: Secondary | ICD-10-CM

## 2021-02-14 DIAGNOSIS — Z7982 Long term (current) use of aspirin: Secondary | ICD-10-CM | POA: Diagnosis not present

## 2021-02-14 DIAGNOSIS — D751 Secondary polycythemia: Secondary | ICD-10-CM

## 2021-02-14 DIAGNOSIS — Z8049 Family history of malignant neoplasm of other genital organs: Secondary | ICD-10-CM | POA: Diagnosis not present

## 2021-02-14 DIAGNOSIS — Z801 Family history of malignant neoplasm of trachea, bronchus and lung: Secondary | ICD-10-CM | POA: Diagnosis not present

## 2021-02-14 DIAGNOSIS — F419 Anxiety disorder, unspecified: Secondary | ICD-10-CM | POA: Diagnosis not present

## 2021-02-14 DIAGNOSIS — I10 Essential (primary) hypertension: Secondary | ICD-10-CM | POA: Diagnosis not present

## 2021-02-14 DIAGNOSIS — Z8249 Family history of ischemic heart disease and other diseases of the circulatory system: Secondary | ICD-10-CM | POA: Insufficient documentation

## 2021-02-14 DIAGNOSIS — Z8349 Family history of other endocrine, nutritional and metabolic diseases: Secondary | ICD-10-CM | POA: Diagnosis not present

## 2021-02-14 DIAGNOSIS — E119 Type 2 diabetes mellitus without complications: Secondary | ICD-10-CM | POA: Diagnosis not present

## 2021-02-14 DIAGNOSIS — E079 Disorder of thyroid, unspecified: Secondary | ICD-10-CM | POA: Insufficient documentation

## 2021-02-14 DIAGNOSIS — F17210 Nicotine dependence, cigarettes, uncomplicated: Secondary | ICD-10-CM | POA: Diagnosis not present

## 2021-02-14 DIAGNOSIS — Z833 Family history of diabetes mellitus: Secondary | ICD-10-CM

## 2021-02-14 DIAGNOSIS — F32A Depression, unspecified: Secondary | ICD-10-CM | POA: Diagnosis not present

## 2021-02-14 LAB — CBC WITH DIFFERENTIAL (CANCER CENTER ONLY)
Abs Immature Granulocytes: 0.01 10*3/uL (ref 0.00–0.07)
Basophils Absolute: 0.1 10*3/uL (ref 0.0–0.1)
Basophils Relative: 1 %
Eosinophils Absolute: 0.5 10*3/uL (ref 0.0–0.5)
Eosinophils Relative: 6 %
HCT: 43.7 % (ref 36.0–46.0)
Hemoglobin: 14.9 g/dL (ref 12.0–15.0)
Immature Granulocytes: 0 %
Lymphocytes Relative: 36 %
Lymphs Abs: 2.7 10*3/uL (ref 0.7–4.0)
MCH: 32.7 pg (ref 26.0–34.0)
MCHC: 34.1 g/dL (ref 30.0–36.0)
MCV: 95.8 fL (ref 80.0–100.0)
Monocytes Absolute: 0.6 10*3/uL (ref 0.1–1.0)
Monocytes Relative: 8 %
Neutro Abs: 3.7 10*3/uL (ref 1.7–7.7)
Neutrophils Relative %: 49 %
Platelet Count: 292 10*3/uL (ref 150–400)
RBC: 4.56 MIL/uL (ref 3.87–5.11)
RDW: 11.9 % (ref 11.5–15.5)
WBC Count: 7.5 10*3/uL (ref 4.0–10.5)
nRBC: 0 % (ref 0.0–0.2)

## 2021-02-14 LAB — SAVE SMEAR (SSMR)

## 2021-02-14 LAB — TECHNOLOGIST SMEAR REVIEW
RBC Morphology: NORMAL
Tech Review: ADEQUATE

## 2021-02-15 ENCOUNTER — Telehealth: Payer: Self-pay | Admitting: Nurse Practitioner

## 2021-02-15 NOTE — Telephone Encounter (Signed)
Checked out appointment. No LOS notes needing to be scheduled. No changes made. 

## 2021-02-18 ENCOUNTER — Other Ambulatory Visit: Payer: Self-pay | Admitting: Family Medicine

## 2021-02-18 DIAGNOSIS — Z1231 Encounter for screening mammogram for malignant neoplasm of breast: Secondary | ICD-10-CM

## 2021-03-28 ENCOUNTER — Other Ambulatory Visit: Payer: Self-pay

## 2021-03-28 ENCOUNTER — Ambulatory Visit
Admission: RE | Admit: 2021-03-28 | Discharge: 2021-03-28 | Disposition: A | Discharge status: Home / Self Care | Payer: No Typology Code available for payment source | Source: Ambulatory Visit | Attending: Family Medicine | Admitting: Family Medicine

## 2021-03-28 DIAGNOSIS — Z1231 Encounter for screening mammogram for malignant neoplasm of breast: Secondary | ICD-10-CM

## 2021-12-05 ENCOUNTER — Ambulatory Visit (INDEPENDENT_AMBULATORY_CARE_PROVIDER_SITE_OTHER): Payer: No Typology Code available for payment source

## 2021-12-05 ENCOUNTER — Ambulatory Visit: Payer: No Typology Code available for payment source | Admitting: Podiatry

## 2021-12-05 ENCOUNTER — Other Ambulatory Visit: Payer: Self-pay

## 2021-12-05 DIAGNOSIS — M79671 Pain in right foot: Secondary | ICD-10-CM

## 2021-12-05 DIAGNOSIS — M722 Plantar fascial fibromatosis: Secondary | ICD-10-CM

## 2021-12-05 MED ORDER — METHYLPREDNISOLONE 4 MG PO TBPK: ORAL_TABLET | ORAL | 0 refills | Status: DC

## 2021-12-05 NOTE — Patient Instructions (Signed)

## 2021-12-05 NOTE — Progress Notes (Signed)
°  Subjective:  Patient ID: [name], female    DOB: 1966/02/01,  MRN: [id number]  Chief Complaint  Patient presents with   Plantar Fasciitis    New Patient  Plantar fascitis  Referring Provider: Kristie Cowman    55 y.o. female presents with the above complaint. History confirmed with patient.  Started in the bottom of the heel per month ago.  Its transition to the outside of the foot does cause swelling and pain and heat.  Has been out of work for about a week and taking diclofenac 75 mg twice daily  Objective:  Physical Exam: warm, good capillary refill, no trophic changes or ulcerative lesions, normal DP and PT pulses, and normal sensory exam. Left Foot: normal exam, no swelling, tenderness, instability; ligaments intact, full range of motion of all ankle/foot joints Right Foot:  Pain and swelling over the lateral and anterior ankle with pain on range of motion and palpation, pain on the plantar heel  No images are attached to the encounter.  Radiographs: Multiple views x-ray of the right foot: no fracture, dislocation, swelling or degenerative changes noted and no plantar calcaneal spur noted no abnormalities in the front of the ankle Assessment:   1. Plantar fasciitis of right foot   2. Right foot pain      Plan:  Patient was evaluated and treated and all questions answered.  Discussed the etiology and treatment options for plantar fasciitis including stretching, formal physical therapy, supportive shoegears such as a running shoe or sneaker, pre fabricated orthoses, injection therapy, and oral medications. We also discussed the role of surgical treatment of this for patients who do not improve after exhausting non-surgical treatment options.   -XR reviewed with patient -Educated patient on stretching and icing of the affected limb -Injection delivered to the plantar fascia of the right foot. -Rx for medrol pack. Educated on use, risks, and benefits of the  medication  After sterile prep with povidone-iodine solution and alcohol, the right heel was injected with 0.5cc 2% xylocaine plain, 0.5cc 0.5% marcaine plain, 5mg  triamcinolone acetonide, and 2mg  dexamethasone was injected along the medial plantar fascia at the insertion on the plantar calcaneus. The patient tolerated the procedure well without complication.    The pain on the outside of the ankle is quite significant and has a decent amount of swelling and pain.  This is more to me than the usual overcompensation of the lateral column and peroneal tendons.  My concern would be for an acute gout flare here and I think it be important to evaluate this.  Lab work was ordered and given to her and she will get this done at [facility]  Return in about 1 month (around 01/05/2022) for recheck plantar fasciitis.

## 2022-01-05 ENCOUNTER — Ambulatory Visit: Payer: No Typology Code available for payment source | Admitting: Podiatry

## 2022-03-10 ENCOUNTER — Other Ambulatory Visit: Payer: Self-pay | Admitting: Internal Medicine

## 2022-03-10 DIAGNOSIS — Z1231 Encounter for screening mammogram for malignant neoplasm of breast: Secondary | ICD-10-CM

## 2022-03-15 ENCOUNTER — Encounter (HOSPITAL_COMMUNITY): Payer: Self-pay | Admitting: Emergency Medicine

## 2022-03-15 ENCOUNTER — Emergency Department (HOSPITAL_COMMUNITY)
Admission: EM | Admit: 2022-03-15 | Discharge: 2022-03-15 | Disposition: A | Discharge status: Home / Self Care | Payer: No Typology Code available for payment source | Attending: Emergency Medicine | Admitting: Emergency Medicine

## 2022-03-15 DIAGNOSIS — M545 Low back pain, unspecified: Secondary | ICD-10-CM | POA: Diagnosis not present

## 2022-03-15 DIAGNOSIS — D72829 Elevated white blood cell count, unspecified: Secondary | ICD-10-CM | POA: Insufficient documentation

## 2022-03-15 DIAGNOSIS — Z79899 Other long term (current) drug therapy: Secondary | ICD-10-CM | POA: Insufficient documentation

## 2022-03-15 DIAGNOSIS — R739 Hyperglycemia, unspecified: Secondary | ICD-10-CM | POA: Diagnosis not present

## 2022-03-15 DIAGNOSIS — F17200 Nicotine dependence, unspecified, uncomplicated: Secondary | ICD-10-CM | POA: Insufficient documentation

## 2022-03-15 DIAGNOSIS — Z7982 Long term (current) use of aspirin: Secondary | ICD-10-CM | POA: Diagnosis not present

## 2022-03-15 LAB — URINALYSIS, ROUTINE W REFLEX MICROSCOPIC
Bilirubin Urine: NEGATIVE
Glucose, UA: NEGATIVE mg/dL
Hgb urine dipstick: NEGATIVE
Ketones, ur: 5 mg/dL — AB
Nitrite: NEGATIVE
Protein, ur: NEGATIVE mg/dL
Specific Gravity, Urine: 1.025 (ref 1.005–1.030)
pH: 5 (ref 5.0–8.0)

## 2022-03-15 LAB — CBG MONITORING, ED: Glucose-Capillary: 125 mg/dL — ABNORMAL HIGH (ref 70–99)

## 2022-03-15 MED ORDER — IBUPROFEN 400 MG PO TABS
400.0000 mg | ORAL_TABLET | Freq: Three times a day (TID) | ORAL | 0 refills | Status: AC
Start: 2022-03-15 — End: 2022-03-18

## 2022-03-15 MED ORDER — METHYL SALICYLATE-LIDO-MENTHOL 4-4-5 % EX PTCH: 1.0000 | MEDICATED_PATCH | Freq: Two times a day (BID) | CUTANEOUS | 0 refills | Status: DC

## 2022-03-15 MED ORDER — TIZANIDINE HCL 2 MG PO CAPS: 2.0000 mg | ORAL_CAPSULE | Freq: Three times a day (TID) | ORAL | 0 refills | Status: AC

## 2022-03-15 MED ORDER — KETOROLAC TROMETHAMINE 30 MG/ML IJ SOLN
30.0000 mg | Freq: Once | INTRAMUSCULAR | Status: AC
Start: 2022-03-15 — End: 2022-03-15
  Administered 2022-03-15: 30 mg via INTRAVENOUS
  Filled 2022-03-15: qty 1

## 2022-03-15 NOTE — ED Triage Notes (Signed)
Per patient, states she has a bad back-states it will go out for no reason-states pain/spasms for 3 days, progressively getting worse-no urine or bowel incontinence  ?

## 2022-03-15 NOTE — ED Provider Triage Note (Signed)
Emergency Medicine Provider Triage Evaluation Note ? ?[name] , a 56 y.o. female  was evaluated in triage.  Pt complains of low, left sided back pain. Patient has history of DM, hypertension, and thyroid disease. Chronic back pain, intermittent in nature. No known recent injury ? ?Review of Systems  ?Positive: Back pain ?Negative: Urinary or fecal incontinence. Fever/chills. Strength deficit. ? ?Physical Exam  ?BP (!) 154/110 (BP Location: Left Arm)   Pulse 94   Temp (!) 97.4 ?F (36.3 ?C) (Oral)   Resp 17   SpO2 94%  ?Gen:   Awake, no distress   ?Resp:  Normal effort  ?MSK:   Moves extremities without difficulty  ?Other:  No CVA tenderness ? ?Medical Decision Making  ?Medically screening exam initiated at 1:19 PM.  Appropriate orders placed.  [name] was informed that the remainder of the evaluation will be completed by another provider, this initial triage assessment does not replace that evaluation, and the importance of remaining in the ED until their evaluation is complete. ? ? ?  ?Etta Quill, NP ?03/15/22 1511 ? ?

## 2022-03-15 NOTE — Discharge Instructions (Addendum)
As discussed, your evaluation today has been largely reassuring.  But, it is important that you monitor your condition carefully, and do not hesitate to return to the ED if you develop new, or concerning changes in your condition. ? ?Otherwise, please follow-up with your physician for appropriate ongoing care. ? ?

## 2022-03-15 NOTE — ED Provider Notes (Signed)
?[facility] DEPT ?Provider Note ? ? ?CSN: [id number] ?Arrival date & time: 03/15/22  1256 ? ?  ? ?History ? ?Chief Complaint  ?Patient presents with  ? Back Pain  ? ? ?[name] is a 56 y.o. female. ? ?HPI ?Patient presents with back pain.  No obvious precipitant.  With past today she has had pain in the left lower back, radiating across, but not down into her leg.  No incontinence, dysuria, frequency, no chest pain, no fever, no chills.  She notes a history of back issues, but none similar to this. ?  ? ?Home Medications ?Prior to Admission medications   ?Medication Sig Start Date End Date Taking? Authorizing Provider  ?ibuprofen (ADVIL) 400 MG tablet Take 1 tablet (400 mg total) by mouth 3 (three) times daily for 3 days. Take one tablet three times daily for three days 03/15/22 03/18/22 Yes Carmin Muskrat, MD  ?Methyl Salicylate-Lido-Menthol 4-4-5 % PTCH Apply 1 patch topically in the morning and at bedtime. 03/15/22  Yes Carmin Muskrat, MD  ?tizanidine (ZANAFLEX) 2 MG capsule Take 1 capsule (2 mg total) by mouth 3 (three) times daily for 7 days. 03/15/22 03/22/22 Yes Carmin Muskrat, MD  ?Aspirin-Salicylamide-Caffeine [facility] FAST PAIN RELIEF) [id number] MG PACK Take 1 Package by mouth daily as needed (PAIN).    [provider]  ?atorvastatin (LIPITOR) 40 MG tablet Take 40 mg by mouth daily. 11/15/21   [provider]  ?cyclobenzaprine (FLEXERIL) 10 MG tablet Take 1 tablet (10 mg total) by mouth 2 (two) times daily as needed for muscle spasms. 01/01/17   Tegeler, Gwenyth Allegra, MD  ?diclofenac (VOLTAREN) 75 MG EC tablet Take 75 mg by mouth 2 (two) times daily as needed. 11/28/21   [provider]  ?INVOKAMET 50-500 MG TABS Take 1 tablet by mouth 2 (two) times daily. 11/14/16   [provider]  ?lisinopril-hydrochlorothiazide (PRINZIDE,ZESTORETIC) 10-12.5 MG per tablet Take 1 tablet by mouth daily.    [provider]  ?metFORMIN  (GLUCOPHAGE) 1000 MG tablet Take 1,000 mg by mouth 2 (two) times daily. 11/07/21   [provider]  ?methylPREDNISolone (MEDROL DOSEPAK) 4 MG TBPK tablet 6 day dose pack - take as directed 12/05/21   Criselda Peaches, DPM  ?oxyCODONE-acetaminophen (PERCOCET/ROXICET) 5-325 MG tablet Take 1 tablet by mouth every 6 (six) hours as needed for severe pain. 01/01/17   Tegeler, Gwenyth Allegra, MD  ?   ? ?Allergies    ?Patient has no known allergies.   ? ?Review of Systems   ?Review of Systems  ?Constitutional:   ?     Per HPI, otherwise negative  ?HENT:    ?     Per HPI, otherwise negative  ?Respiratory:    ?     Per HPI, otherwise negative  ?Cardiovascular:   ?     Per HPI, otherwise negative  ?Gastrointestinal:  Negative for vomiting.  ?Endocrine:  ?     Negative aside from HPI  ?Genitourinary:   ?     Neg aside from HPI   ?Musculoskeletal:   ?     Per HPI, otherwise negative  ?Skin: Negative.   ?Neurological:  Negative for syncope.  ? ?Physical Exam ?Updated Vital Signs ?BP (!) 154/110 (BP Location: Left Arm)   Pulse 94   Temp (!) 97.4 ?F (36.3 ?C) (Oral)   Resp 17   SpO2 94%  ?Physical Exam ?Vitals and nursing note reviewed.  ?Constitutional:   ?   General: She  is not in acute distress. ?   Appearance: She is well-developed.  ?HENT:  ?   Head: Normocephalic and atraumatic.  ?Eyes:  ?   Conjunctiva/sclera: Conjunctivae normal.  ?Cardiovascular:  ?   Rate and Rhythm: Normal rate and regular rhythm.  ?Pulmonary:  ?   Effort: Pulmonary effort is normal. No respiratory distress.  ?   Breath sounds: Normal breath sounds. No stridor.  ?Abdominal:  ?   General: There is no distension.  ?Musculoskeletal:  ?   Comments: Positive referred pain with hip flexion left to pain in the left back, otherwise musculoskeletal exam unremarkable  ?Skin: ?   General: Skin is warm and dry.  ?Neurological:  ?   General: No focal deficit present.  ?   Mental Status: She is alert and oriented to person, place, and time.  ?   Cranial  Nerves: No cranial nerve deficit.  ?   Sensory: No sensory deficit.  ?   Motor: No weakness.  ?   Coordination: Coordination normal.  ?Psychiatric:     ?   Mood and Affect: Mood normal.  ? ? ?ED Results / Procedures / Treatments   ?Labs ?(all labs ordered are listed, but only abnormal results are displayed) ?Labs Reviewed  ?URINALYSIS, ROUTINE W REFLEX MICROSCOPIC - Abnormal; Notable for the following components:  ?    Result Value  ? APPearance HAZY (*)   ? Ketones, ur 5 (*)   ? Leukocytes,Ua SMALL (*)   ? Bacteria, UA RARE (*)   ? All other components within normal limits  ?CBG MONITORING, ED - Abnormal; Notable for the following components:  ? Glucose-Capillary 125 (*)   ? All other components within normal limits  ? ? ?EKG ?None ? ?Radiology ?No results found. ? ?Procedures ?Procedures  ? ? ?Medications Ordered in ED ?Medications  ?ketorolac (TORADOL) 30 MG/ML injection 30 mg (30 mg Intravenous Given 03/15/22 1507)  ? ? ?ED Course/ Medical Decision Making/ A&P ?This patient with a Hx of back pain presents to the ED for concern of back pain, atypical for her, but no red flags suggesting CNS dysfunction, no incontinence, no fever, no loss of sensation distally, this involves an extensive number of treatment options, and is a complaint that carries with it a high risk of complications and morbidity.   ? ?The differential diagnosis includes musculoskeletal strain, radiculopathy, less likely infection, kidney stone ? ? ?Social Determinants of Health: ? ?Smoking ? ?Additional history obtained: ? ?Additional history and/or information obtained from chart review, notable for smoking history ? ? ?After the initial evaluation, orders, including: Urinalysis, CBG were initiated. ?Toradol provided ? ?Patient placed on Cardiac and Pulse-Oximetry Monitors. ?The patient was maintained on a cardiac monitor.  The cardiac monitored showed an rhythm of sinus 90 normal ?The patient was also maintained on pulse oximetry. The readings  were typically 100% room air normal ? ? ?On repeat evaluation of the patient improved ? ?Lab Tests: ? ?I personally interpreted labs.  The pertinent results include: CBG with slight hyperglycemia, urinalysis without infection or evidence for stones ? ? ?Dispostion / Final MDM: ? ?After consideration of the diagnostic results and the patient's response to treatment, patient has improved with Toradol, has reassuring labs, no distal neurovascular compromise, no red flags suggesting CNS dysfunction, was started on multiple medications for symptom control, discharged to follow-up with primary care. ? ?Final Clinical Impression(s) / ED Diagnoses ?Final diagnoses:  ?Acute left-sided low back pain without sciatica  ? ? ?Rx /  DC Orders ?ED Discharge Orders   ? ?      Ordered  ?  tizanidine (ZANAFLEX) 2 MG capsule  3 times daily       ? 03/15/22 1630  ?  ibuprofen (ADVIL) 400 MG tablet  3 times daily       ? 03/15/22 1630  ?  Methyl Salicylate-Lido-Menthol 4-4-5 % PTCH  2 times daily       ?Note to Pharmacy: These may be available over-the-counter as a substitute.  ? 03/15/22 1630  ? ?  ?  ? ?  ? ? ?  ?Carmin Muskrat, MD ?03/15/22 1633 ? ?

## 2022-04-13 ENCOUNTER — Inpatient Hospital Stay: Admission: RE | Admit: 2022-04-13 | Payer: No Typology Code available for payment source | Source: Ambulatory Visit

## 2022-10-05 ENCOUNTER — Ambulatory Visit: Payer: No Typology Code available for payment source | Admitting: Nurse Practitioner

## 2022-10-05 DIAGNOSIS — Z23 Encounter for immunization: Secondary | ICD-10-CM

## 2022-10-05 DIAGNOSIS — Z1231 Encounter for screening mammogram for malignant neoplasm of breast: Secondary | ICD-10-CM

## 2022-10-05 NOTE — Progress Notes (Signed)
Patient is interested in obtaining screening mammogram via mobile mammogram bus at [facility]. No concerns at this time. Last mammogram: 2021  Order placed.  1st Dose of Shingrix given today. CDC VIS given prior to injection. Lot number: [id number], expiration date: 09/13/24 Tolerated well. No immediate adverse reactions noted. Educated and discussed red flag symptoms and to call 911 or go to emergency room with signs of anaphylaxis.  Take OTC tylenol as needed for expected side effects such as muscle aches or fever.  Patient given the chance to ask all questions and discussed answers.  RTC in 2 months for 2nd dose of shingrix vaccine to complete series, or sooner as needed.

## 2022-10-18 ENCOUNTER — Ambulatory Visit
Admission: RE | Admit: 2022-10-18 | Discharge: 2022-10-18 | Disposition: A | Discharge status: Home / Self Care | Payer: No Typology Code available for payment source | Source: Ambulatory Visit | Attending: Nurse Practitioner | Admitting: Nurse Practitioner

## 2022-10-18 DIAGNOSIS — Z1231 Encounter for screening mammogram for malignant neoplasm of breast: Secondary | ICD-10-CM

## 2022-12-21 ENCOUNTER — Encounter: Payer: Self-pay | Admitting: Nurse Practitioner

## 2022-12-21 ENCOUNTER — Ambulatory Visit: Payer: No Typology Code available for payment source | Admitting: Nurse Practitioner

## 2022-12-21 VITALS — BP 138/80 | HR 100

## 2022-12-21 DIAGNOSIS — J069 Acute upper respiratory infection, unspecified: Secondary | ICD-10-CM

## 2022-12-21 MED ORDER — ALBUTEROL SULFATE HFA 108 (90 BASE) MCG/ACT IN AERS
2.0000 | INHALATION_SPRAY | Freq: Four times a day (QID) | RESPIRATORY_TRACT | 0 refills | Status: DC | PRN
Start: 2022-12-21 — End: 2024-03-06

## 2022-12-21 MED ORDER — BENZONATATE 100 MG PO CAPS: 100.0000 mg | ORAL_CAPSULE | Freq: Two times a day (BID) | ORAL | 0 refills | Status: DC | PRN

## 2022-12-21 NOTE — Progress Notes (Signed)
Acute Office Visit  Subjective:     Patient ID: [name], female    DOB: 1966/08/23, 57 y.o.   MRN: [id number]   HPI Patient presents today for complaints of congestion, body aches, cough, x 3 days ago. Feels tired. Reports she has felt "feverish" but has checked her temperature and has not had any fevers.  Currently has not taking anything but robitussin Denies recent Covid or flu contacts.   Reports she plans to see her PCP Monday but would like COVID test prior to her visit.    Review of Systems  Constitutional:  Positive for malaise/fatigue. Negative for chills and fever.  HENT:  Positive for congestion and sore throat. Negative for ear pain and sinus pain.   Respiratory:  Positive for cough, shortness of breath (off and on) and wheezing. Negative for sputum production.   Cardiovascular:  Negative for chest pain.  Gastrointestinal:  Negative for constipation, diarrhea, nausea and vomiting.  Musculoskeletal:  Positive for joint pain (not new) and myalgias (not new).  Neurological:  Positive for headaches.        Objective:    BP 138/80   Pulse 100   SpO2 96%    Physical Exam Constitutional:      General: She is not in acute distress. HENT:     Head: Normocephalic.     Nose: Congestion present.     Right Sinus: No maxillary sinus tenderness or frontal sinus tenderness.     Left Sinus: No maxillary sinus tenderness or frontal sinus tenderness.     Mouth/Throat:     Pharynx: Posterior oropharyngeal erythema present. No oropharyngeal exudate.  Eyes:     Conjunctiva/sclera: Conjunctivae normal.     Pupils: Pupils are equal, round, and reactive to light.  Cardiovascular:     Rate and Rhythm: Regular rhythm. Tachycardia present.     Pulses: Normal pulses.     Heart sounds: Normal heart sounds.     Comments: Slightly tacycardiac  Pulmonary:     Effort: Pulmonary effort is normal. No respiratory distress.     Breath sounds: Normal breath sounds. No wheezing.   Musculoskeletal:        General: Normal range of motion.     Right lower leg: No edema.     Left lower leg: No edema.  Neurological:     General: No focal deficit present.     Mental Status: She is alert and oriented to person, place, and time.    RAPID COVID AND FLU test both negative.  No results found for any visits on 12/21/22.      Assessment & Plan:   Problem List Items Addressed This Visit   None Visit Diagnoses     Viral upper respiratory tract infection    -  Primary   Relevant Medications   benzonatate (TESSALON) 100 MG capsule   albuterol (VENTOLIN HFA) 108 (90 Base) MCG/ACT inhaler     Tested negative for flu and covid. Symptomatic treatment at this time. Encouraged use of mucinex and increase fluid intake as well as use of steam to help with congestion.  Albuterol as needed for SOB.  Instructed to follow-up if symptoms worsen or fail to improve.   Meds ordered this encounter  Medications   benzonatate (TESSALON) 100 MG capsule    Sig: Take 1 capsule (100 mg total) by mouth 2 (two) times daily as needed for cough.    Dispense:  20 capsule    Refill:  0  Order Specific Question:   Supervising Provider    Answer:   Odis Luster [[id number]]   albuterol (VENTOLIN HFA) 108 (90 Base) MCG/ACT inhaler    Sig: Inhale 2 puffs into the lungs every 6 (six) hours as needed for wheezing or shortness of breath.    Dispense:  8 g    Refill:  0    Order Specific Question:   Supervising Provider    Answer:   Odis Luster [[id number]]    Follow-up as needed.  Lurena Joiner, NP

## 2022-12-28 ENCOUNTER — Ambulatory Visit: Payer: No Typology Code available for payment source | Admitting: Nurse Practitioner

## 2022-12-28 DIAGNOSIS — Z23 Encounter for immunization: Secondary | ICD-10-CM

## 2022-12-28 NOTE — Progress Notes (Signed)
2nd Dose of Shingrix given today.  Tolerated well. No immediate adverse reactions noted. Educated and discussed red flag symptoms and to call 911 or go to emergency room with signs of anaphylaxis.  Take OTC tylenol as needed for expected side effects such as muscle aches or fever.  Patient given the chance to ask all questions and discussed answers.  RTC as needed.

## 2023-02-04 ENCOUNTER — Other Ambulatory Visit: Payer: Self-pay

## 2023-02-04 ENCOUNTER — Encounter (HOSPITAL_COMMUNITY): Payer: Self-pay | Admitting: Emergency Medicine

## 2023-02-04 ENCOUNTER — Emergency Department (HOSPITAL_COMMUNITY): Payer: PRIVATE HEALTH INSURANCE

## 2023-02-04 ENCOUNTER — Emergency Department (HOSPITAL_COMMUNITY)
Admission: EM | Admit: 2023-02-04 | Discharge: 2023-02-04 | Disposition: A | Discharge status: Home / Self Care | Payer: PRIVATE HEALTH INSURANCE | Attending: Emergency Medicine | Admitting: Emergency Medicine

## 2023-02-04 DIAGNOSIS — Z79899 Other long term (current) drug therapy: Secondary | ICD-10-CM | POA: Insufficient documentation

## 2023-02-04 DIAGNOSIS — Z7982 Long term (current) use of aspirin: Secondary | ICD-10-CM | POA: Insufficient documentation

## 2023-02-04 DIAGNOSIS — Z7984 Long term (current) use of oral hypoglycemic drugs: Secondary | ICD-10-CM | POA: Diagnosis not present

## 2023-02-04 DIAGNOSIS — I10 Essential (primary) hypertension: Secondary | ICD-10-CM | POA: Diagnosis not present

## 2023-02-04 DIAGNOSIS — R058 Other specified cough: Secondary | ICD-10-CM | POA: Diagnosis not present

## 2023-02-04 DIAGNOSIS — E119 Type 2 diabetes mellitus without complications: Secondary | ICD-10-CM | POA: Diagnosis not present

## 2023-02-04 DIAGNOSIS — N644 Mastodynia: Secondary | ICD-10-CM | POA: Diagnosis present

## 2023-02-04 DIAGNOSIS — R072 Precordial pain: Secondary | ICD-10-CM | POA: Diagnosis not present

## 2023-02-04 LAB — CBC WITH DIFFERENTIAL/PLATELET
Abs Immature Granulocytes: 0.02 10*3/uL (ref 0.00–0.07)
Basophils Absolute: 0.1 10*3/uL (ref 0.0–0.1)
Basophils Relative: 1 %
Eosinophils Absolute: 0.4 10*3/uL (ref 0.0–0.5)
Eosinophils Relative: 4 %
HCT: 40.3 % (ref 36.0–46.0)
Hemoglobin: 13.5 g/dL (ref 12.0–15.0)
Immature Granulocytes: 0 %
Lymphocytes Relative: 35 %
Lymphs Abs: 2.7 10*3/uL (ref 0.7–4.0)
MCH: 30.8 pg (ref 26.0–34.0)
MCHC: 33.5 g/dL (ref 30.0–36.0)
MCV: 92 fL (ref 80.0–100.0)
Monocytes Absolute: 0.7 10*3/uL (ref 0.1–1.0)
Monocytes Relative: 9 %
Neutro Abs: 4 10*3/uL (ref 1.7–7.7)
Neutrophils Relative %: 51 %
Platelets: 398 10*3/uL (ref 150–400)
RBC: 4.38 MIL/uL (ref 3.87–5.11)
RDW: 12.6 % (ref 11.5–15.5)
WBC: 7.9 10*3/uL (ref 4.0–10.5)
nRBC: 0 % (ref 0.0–0.2)

## 2023-02-04 LAB — BASIC METABOLIC PANEL
Anion gap: 9 (ref 5–15)
BUN: 10 mg/dL (ref 6–20)
CO2: 24 mmol/L (ref 22–32)
Calcium: 8.7 mg/dL — ABNORMAL LOW (ref 8.9–10.3)
Chloride: 105 mmol/L (ref 98–111)
Creatinine, Ser: 0.46 mg/dL (ref 0.44–1.00)
GFR, Estimated: >60 mL/min (ref >60)
Glucose, Bld: 159 mg/dL — ABNORMAL HIGH (ref 70–99)
Potassium: 4.3 mmol/L (ref 3.5–5.1)
Sodium: 138 mmol/L (ref 135–145)

## 2023-02-04 LAB — TROPONIN I (HIGH SENSITIVITY)
Troponin I (High Sensitivity): 2 ng/L (ref <18)
Troponin I (High Sensitivity): 2 ng/L (ref <18)

## 2023-02-04 LAB — D-DIMER, QUANTITATIVE: D-Dimer, Quant: 0.38 ug/mL-FEU (ref 0.00–0.50)

## 2023-02-04 MED ORDER — OXYCODONE-ACETAMINOPHEN 5-325 MG PO TABS
1.0000 | ORAL_TABLET | Freq: Once | ORAL | Status: AC
  Administered 2023-02-04: 1 via ORAL
  Filled 2023-02-04: qty 1

## 2023-02-04 NOTE — ED Provider Notes (Signed)
[facility] AT [facility] Provider Note   CSN: [id number] Arrival date & time: 02/04/23  1028     History  Chief Complaint  Patient presents with   Breast Pain    Right     [name] is a 57 y.o. female.  Patient with history of diabetes, hypertension, high cholesterol, smoking history presents to the emergency department for evaluation of pain in the right chest.  Patient has had a intermittent sharp pain behind her right breast.  This is worse with movement of the arm and deep breathing.  Patient states that over the past 6 weeks she has had a couple of respiratory infections including the flu.  She does not feel like she has fully recovered from these.  She has had a persistent cough.  No significant shortness of breath.  No lightheadedness or syncope.  Patient denies risk factors for pulmonary embolism including: unilateral leg swelling, history of DVT/PE/other blood clots, use of exogenous hormones, recent immobilizations, recent surgery, recent travel (>4hr segment), malignancy, hemoptysis.          Home Medications Prior to Admission medications   Medication Sig Start Date End Date Taking? Authorizing Provider  albuterol (VENTOLIN HFA) 108 (90 Base) MCG/ACT inhaler Inhale 2 puffs into the lungs every 6 (six) hours as needed for wheezing or shortness of breath. 12/21/22   Gloris Ham, NP  Aspirin-Salicylamide-Caffeine (BC FAST PAIN RELIEF) [phone] MG PACK Take 1 Package by mouth daily as needed (PAIN).    [provider]  atorvastatin (LIPITOR) 40 MG tablet Take 40 mg by mouth daily. 11/15/21   [provider]  benzonatate (TESSALON) 100 MG capsule Take 1 capsule (100 mg total) by mouth 2 (two) times daily as needed for cough. 12/21/22   Gloris Ham, NP  cyclobenzaprine (FLEXERIL) 10 MG tablet Take 1 tablet (10 mg total) by mouth 2 (two) times daily as needed for muscle spasms. 01/01/17   Tegeler,  Canary Brim, MD  diclofenac (VOLTAREN) 75 MG EC tablet Take 75 mg by mouth 2 (two) times daily as needed. 11/28/21   [provider]  lisinopril (ZESTRIL) 10 MG tablet Take 10 mg by mouth daily. 11/24/22   [provider]  metFORMIN (GLUCOPHAGE) 1000 MG tablet Take 1,000 mg by mouth 2 (two) times daily. 11/07/21   [provider]      Allergies    Patient has no known allergies.    Review of Systems   Review of Systems  Physical Exam Updated Vital Signs BP (!) 170/77 Comment: patient ran out of BP meds  Pulse 89   Temp (!) 97.5 F (36.4 C)   Resp 16   Ht 5\' 5"  (1.651 m)   Wt 64.4 kg   SpO2 96%   BMI 23.63 kg/m   Physical Exam Vitals and nursing note reviewed.  Constitutional:      Appearance: She is well-developed. She is not diaphoretic.  HENT:     Head: Normocephalic and atraumatic.     Mouth/Throat:     Mouth: Mucous membranes are moist. Mucous membranes are not dry.  Eyes:     Conjunctiva/sclera: Conjunctivae normal.  Neck:     Vascular: Normal carotid pulses. No JVD.     Trachea: Trachea normal. No tracheal deviation.  Cardiovascular:     Rate and Rhythm: Normal rate and regular rhythm.     Pulses: No decreased pulses.          Radial  pulses are 2+ on the right side and 2+ on the left side.     Heart sounds: Normal heart sounds, S1 normal and S2 normal. No murmur heard. Pulmonary:     Effort: Pulmonary effort is normal. No respiratory distress.     Breath sounds: No wheezing.  Chest:     Chest wall: Tenderness present.    Abdominal:     General: Bowel sounds are normal.     Palpations: Abdomen is soft.     Tenderness: There is no abdominal tenderness. There is no guarding or rebound.  Musculoskeletal:        General: Normal range of motion.     Cervical back: Normal range of motion and neck supple. No muscular tenderness.  Skin:    General: Skin is warm and dry.     Coloration: Skin is not pale.  Neurological:     Mental  Status: She is alert.     ED Results / Procedures / Treatments   Labs (all labs ordered are listed, but only abnormal results are displayed) Labs Reviewed  BASIC METABOLIC PANEL - Abnormal; Notable for the following components:      Result Value   Glucose, Bld 159 (*)    Calcium 8.7 (*)    All other components within normal limits  CBC WITH DIFFERENTIAL/PLATELET  D-DIMER, QUANTITATIVE  TROPONIN I (HIGH SENSITIVITY)  TROPONIN I (HIGH SENSITIVITY)    EKG EKG Interpretation  Date/Time:  Sunday February 04 2023 10:40:52 EST Ventricular Rate:  88 PR Interval:  154 QRS Duration: 75 QT Interval:  354 QTC Calculation: 429 R Axis:   89 Text Interpretation: Sinus rhythm Anteroseptal infarct, age indeterminate No previous ECGs available Confirmed by Vanetta Mulders [phone]) on 02/04/2023 11:03:48 AM  Radiology DG Chest 2 View  Result Date: 02/04/2023 CLINICAL DATA:  57 year old female with history of right-sided chest pain. EXAM: CHEST - 2 VIEW COMPARISON:  No priors. FINDINGS: Lung volumes are normal. No consolidative airspace disease. No pleural effusions. No pneumothorax. No pulmonary nodule or mass noted. Pulmonary vasculature and the cardiomediastinal silhouette are within normal limits. Atherosclerosis in the thoracic aorta. IMPRESSION: 1.  No radiographic evidence of acute cardiopulmonary disease. 2. Aortic atherosclerosis. Electronically Signed   By: Trudie Reed M.D.   On: 02/04/2023 12:42    Procedures Procedures    Medications Ordered in ED Medications  oxyCODONE-acetaminophen (PERCOCET/ROXICET) 5-325 MG per tablet 1 tablet (1 tablet Oral Given 02/04/23 1223)    ED Course/ Medical Decision Making/ A&P    Patient seen and examined. History obtained directly from patient.   Labs/EKG: Ordered CBC, BMP, troponin, D-dimer.  EKG personally reviewed and interpreted as above.  Imaging: Ordered chest x-ray.  Medications/Fluids: Ordered: P.o. Percocet.   Most recent  vital signs reviewed and are as follows: BP (!) 170/77 Comment: patient ran out of BP meds  Pulse 89   Temp (!) 97.5 F (36.4 C)   Resp 16   Ht 5\' 5"  (1.651 m)   Wt 64.4 kg   SpO2 96%   BMI 23.63 kg/m   Initial impression: Atypical right-sided chest pain  4:26 PM Reassessment performed. Patient appears stable.  Labs personally reviewed and interpreted including: CBC unremarkable, BMP glucose elevated at 159 with normal anion gap; troponin 2 > 2; D-dimer negative.  Imaging personally visualized and interpreted including: X-ray of the chest without acute findings such as pneumonia.  Aortic atherosclerosis noted.  Reviewed pertinent lab work and imaging with patient at bedside. Questions answered.  Most current vital signs reviewed and are as follows: BP (!) 144/92   Pulse 78   Temp 97.8 F (36.6 C) (Oral)   Resp 14   Ht 5\' 5"  (1.651 m)   Wt 64.4 kg   SpO2 93%   BMI 23.63 kg/m   Plan: Discharge to home.   Prescriptions written for: None  Other home care instructions discussed: Rest, OTC meds, warm compresses  ED return instructions discussed: Return and follow-up instructions: I encouraged patient to return to ED with severe chest pain, especially if the pain is crushing or pressure-like and spreads to the arms, back, neck, or jaw, or if they have associated sweating, vomiting, or shortness of breath with the pain, or significant pain with activity. We discussed that the evaluation here today indicates a low-risk of serious cause of chest pain, including heart trouble or a blood clot, but no evaluation is perfect and chest pain can evolve with time. The patient verbalized understanding and agreed.  I encouraged patient to follow-up with their provider in the next 48 hours for recheck.                              Medical Decision Making Amount and/or Complexity of Data Reviewed Labs: ordered. Radiology: ordered.  Risk Prescription drug management.   For this  patient's complaint of chest pain, the following emergent conditions were considered on the differential diagnosis: acute coronary syndrome, pulmonary embolism, pneumothorax, myocarditis, pericardial tamponade, aortic dissection, thoracic aortic aneurysm complication, esophageal perforation.   Other causes were also considered including: gastroesophageal reflux disease, musculoskeletal pain including costochondritis, pneumonia/pleurisy, herpes zoster, pericarditis.  In regards to possibility of ACS, patient has atypical features of pain, non-ischemic and unchanged EKG and negative troponin(s). Heart score was calculated to be 3.   In regards to possibility of PE, symptoms are atypical for PE and risk profile is low, d-dimer is negative.          Final Clinical Impression(s) / ED Diagnoses Final diagnoses:  Precordial pain    Rx / DC Orders ED Discharge Orders     None         Renne Crigler, PA-C 02/04/23 1628    Vanetta Mulders, MD 02/07/23 [phone]

## 2023-02-04 NOTE — ED Triage Notes (Signed)
Patient arrives ambulatory by POV c/o pain behind right breast x 1 week. Patient states pain is worse with inspiration. Patient states pain is also to right side and into her back. Recent URI. Denies any shortness of breath. Daily smoker.

## 2023-02-04 NOTE — Discharge Instructions (Signed)
Please read and follow all provided instructions.  Your diagnoses today include:  1. Precordial pain     Tests performed today include: An EKG of your heart A chest x-ray Cardiac enzymes - a blood test for heart muscle damage Blood counts and electrolytes Vital signs. See below for your results today.   Medications prescribed:  None  Take any prescribed medications only as directed.  Follow-up instructions: Please follow-up with your primary care provider as soon as you can for further evaluation of your symptoms.   Return instructions:  SEEK IMMEDIATE MEDICAL ATTENTION IF: You have severe chest pain, especially if the pain is crushing or pressure-like and spreads to the arms, back, neck, or jaw, or if you have sweating, nausea or vomiting, or trouble with breathing. THIS IS AN EMERGENCY. Do not wait to see if the pain will go away. Get medical help at once. Call 911. DO NOT drive yourself to the hospital.  Your chest pain gets worse and does not go away after a few minutes of rest.  You have an attack of chest pain lasting longer than what you usually experience.  You have significant dizziness, if you pass out, or have trouble walking.  You have chest pain not typical of your usual pain for which you originally saw your caregiver.  You have any other emergent concerns regarding your health.  Additional Information: Chest pain comes from many different causes. Your caregiver has diagnosed you as having chest pain that is not specific for one problem, but does not require admission.  You are at low risk for an acute heart condition or other serious illness.   Your vital signs today were: BP (!) 144/92   Pulse 78   Temp 97.8 F (36.6 C) (Oral)   Resp 14   Ht 5' 5"$  (1.651 m)   Wt 64.4 kg   SpO2 93%   BMI 23.63 kg/m  If your blood pressure (BP) was elevated above 135/85 this visit, please have this repeated by your doctor within one month. --------------

## 2023-03-07 ENCOUNTER — Emergency Department (HOSPITAL_COMMUNITY): Payer: PRIVATE HEALTH INSURANCE

## 2023-03-07 ENCOUNTER — Encounter (HOSPITAL_COMMUNITY): Payer: Self-pay

## 2023-03-07 ENCOUNTER — Emergency Department (HOSPITAL_COMMUNITY)
Admission: EM | Admit: 2023-03-07 | Discharge: 2023-03-07 | Disposition: A | Discharge status: Home / Self Care | Payer: PRIVATE HEALTH INSURANCE | Attending: Emergency Medicine | Admitting: Emergency Medicine

## 2023-03-07 ENCOUNTER — Other Ambulatory Visit: Payer: Self-pay

## 2023-03-07 DIAGNOSIS — R1031 Right lower quadrant pain: Secondary | ICD-10-CM | POA: Insufficient documentation

## 2023-03-07 DIAGNOSIS — R109 Unspecified abdominal pain: Secondary | ICD-10-CM

## 2023-03-07 LAB — URINALYSIS, ROUTINE W REFLEX MICROSCOPIC
Bacteria, UA: NONE SEEN
Bilirubin Urine: NEGATIVE
Glucose, UA: 50 mg/dL — AB
Ketones, ur: 5 mg/dL — AB
Leukocytes,Ua: NEGATIVE
Nitrite: NEGATIVE
Protein, ur: NEGATIVE mg/dL
RBC / HPF: >50 RBC/hpf (ref 0–5)
Specific Gravity, Urine: 1.018 (ref 1.005–1.030)
pH: 5 (ref 5.0–8.0)

## 2023-03-07 LAB — CBC WITH DIFFERENTIAL/PLATELET
Abs Immature Granulocytes: 0.07 10*3/uL (ref 0.00–0.07)
Basophils Absolute: 0.1 10*3/uL (ref 0.0–0.1)
Basophils Relative: 0 %
Eosinophils Absolute: 0.1 10*3/uL (ref 0.0–0.5)
Eosinophils Relative: 1 %
HCT: 42.8 % (ref 36.0–46.0)
Hemoglobin: 14.2 g/dL (ref 12.0–15.0)
Immature Granulocytes: 0 %
Lymphocytes Relative: 8 %
Lymphs Abs: 1.2 10*3/uL (ref 0.7–4.0)
MCH: 30.9 pg (ref 26.0–34.0)
MCHC: 33.2 g/dL (ref 30.0–36.0)
MCV: 93 fL (ref 80.0–100.0)
Monocytes Absolute: 1.1 10*3/uL — ABNORMAL HIGH (ref 0.1–1.0)
Monocytes Relative: 7 %
Neutro Abs: 13.7 10*3/uL — ABNORMAL HIGH (ref 1.7–7.7)
Neutrophils Relative %: 84 %
Platelets: 325 10*3/uL (ref 150–400)
RBC: 4.6 MIL/uL (ref 3.87–5.11)
RDW: 12.4 % (ref 11.5–15.5)
WBC: 16.3 10*3/uL — ABNORMAL HIGH (ref 4.0–10.5)
nRBC: 0 % (ref 0.0–0.2)

## 2023-03-07 LAB — COMPREHENSIVE METABOLIC PANEL
ALT: 27 U/L (ref 0–44)
AST: 20 U/L (ref 15–41)
Albumin: 4.5 g/dL (ref 3.5–5.0)
Alkaline Phosphatase: 74 U/L (ref 38–126)
Anion gap: 9 (ref 5–15)
BUN: 14 mg/dL (ref 6–20)
CO2: 24 mmol/L (ref 22–32)
Calcium: 8.8 mg/dL — ABNORMAL LOW (ref 8.9–10.3)
Chloride: 103 mmol/L (ref 98–111)
Creatinine, Ser: 0.67 mg/dL (ref 0.44–1.00)
GFR, Estimated: >60 mL/min (ref >60)
Glucose, Bld: 267 mg/dL — ABNORMAL HIGH (ref 70–99)
Potassium: 3.9 mmol/L (ref 3.5–5.1)
Sodium: 136 mmol/L (ref 135–145)
Total Bilirubin: 0.4 mg/dL (ref 0.3–1.2)
Total Protein: 7.1 g/dL (ref 6.5–8.1)

## 2023-03-07 LAB — LIPASE, BLOOD: Lipase: 21 U/L (ref 11–51)

## 2023-03-07 MED ORDER — TAMSULOSIN HCL 0.4 MG PO CAPS: 0.4000 mg | ORAL_CAPSULE | Freq: Every day | ORAL | 0 refills | Status: DC

## 2023-03-07 MED ORDER — ONDANSETRON 4 MG PO TBDP
4.0000 mg | ORAL_TABLET | Freq: Once | ORAL | Status: AC
  Administered 2023-03-07: 4 mg via ORAL
  Filled 2023-03-07: qty 1

## 2023-03-07 MED ORDER — HYDROCODONE-ACETAMINOPHEN 5-325 MG PO TABS
1.0000 | ORAL_TABLET | ORAL | 0 refills | Status: AC | PRN
Start: 2023-03-07 — End: 2023-03-10

## 2023-03-07 MED ORDER — ONDANSETRON HCL 4 MG PO TABS: 4.0000 mg | ORAL_TABLET | Freq: Four times a day (QID) | ORAL | 0 refills | Status: DC

## 2023-03-07 MED ORDER — HYDROCODONE-ACETAMINOPHEN 5-325 MG PO TABS
1.0000 | ORAL_TABLET | Freq: Once | ORAL | Status: AC
  Administered 2023-03-07: 1 via ORAL
  Filled 2023-03-07: qty 1

## 2023-03-07 MED ORDER — KETOROLAC TROMETHAMINE 15 MG/ML IJ SOLN
15.0000 mg | Freq: Once | INTRAMUSCULAR | Status: AC
  Administered 2023-03-07: 15 mg via INTRAVENOUS
  Filled 2023-03-07: qty 1

## 2023-03-07 MED ORDER — HYDROCODONE-ACETAMINOPHEN 5-325 MG PO TABS
1.0000 | ORAL_TABLET | ORAL | 0 refills | Status: DC | PRN
Start: 2023-03-07 — End: 2023-03-07

## 2023-03-07 MED ORDER — SODIUM CHLORIDE 0.9 % IV BOLUS
1000.0000 mL | Freq: Once | INTRAVENOUS | Status: AC
  Administered 2023-03-07: 1000 mL via INTRAVENOUS

## 2023-03-07 MED ORDER — TAMSULOSIN HCL 0.4 MG PO CAPS
0.4000 mg | ORAL_CAPSULE | Freq: Every day | ORAL | 0 refills | Status: AC
Start: 2023-03-07 — End: 2023-03-14

## 2023-03-07 NOTE — ED Triage Notes (Signed)
EMS report: RLQ abdominal pain radiating into the back ongoing since 3:30 pm. Endorses hematuria x 1 week. Denies hx kidney stones   EMS VS BP 170/98, 91 PR, 18 RR, 96% RA, CBG 260.

## 2023-03-07 NOTE — ED Provider Notes (Signed)
[facility] Provider Note   CSN: [id number] Arrival date & time: 03/07/23  1939     History  Chief Complaint  Patient presents with   Abdominal Pain    [name] is a 57 y.o. female.  57 y.o female with no PMH presents to the ED with a chief complaint of sudden onset of right flank pain at 3pm today.  Patient was at work when she suddenly got sudden onset of right flank pain shooting into her right lower abdomen.  She does report when the pain came she felt nauseous, had 1 episode of nonbilious, nonbloody emesis.  She has not been able to keep anything down since.  She did not try taking any medication for improvement in symptoms. No alleviating or exacerbating factors. She does not have a prior history of kidney stones.  She denies any urinary symptoms at this time, no chest pain, no shortness of breath or fever.  The history is provided by the patient.  Abdominal Pain Associated symptoms: nausea and vomiting   Associated symptoms: no chest pain, no chills, no fever and no shortness of breath        Home Medications Prior to Admission medications   Medication Sig Start Date End Date Taking? Authorizing Provider  HYDROcodone-acetaminophen (NORCO/VICODIN) 5-325 MG tablet Take 1 tablet by mouth every 4 (four) hours as needed for up to 3 days. 03/07/23 03/10/23 Yes Jadarrius Maselli, PA-C  ondansetron (ZOFRAN) 4 MG tablet Take 1 tablet (4 mg total) by mouth every 6 (six) hours. 03/07/23  Yes Precious Gilchrest, Beverley Fiedler, PA-C  tamsulosin (FLOMAX) 0.4 MG CAPS capsule Take 1 capsule (0.4 mg total) by mouth daily for 7 days. 03/07/23 03/14/23 Yes Tyeisha Dinan, Beverley Fiedler, PA-C  albuterol (VENTOLIN HFA) 108 (90 Base) MCG/ACT inhaler Inhale 2 puffs into the lungs every 6 (six) hours as needed for wheezing or shortness of breath. 12/21/22   Lurena Joiner, NP  Aspirin-Salicylamide-Caffeine (BC FAST PAIN RELIEF) [phone] MG PACK Take 1 Package by mouth daily as needed  (PAIN).    [provider]  atorvastatin (LIPITOR) 40 MG tablet Take 40 mg by mouth daily. 11/15/21   [provider]  benzonatate (TESSALON) 100 MG capsule Take 1 capsule (100 mg total) by mouth 2 (two) times daily as needed for cough. 12/21/22   Lurena Joiner, NP  cyclobenzaprine (FLEXERIL) 10 MG tablet Take 1 tablet (10 mg total) by mouth 2 (two) times daily as needed for muscle spasms. 01/01/17   Tegeler, Gwenyth Allegra, MD  diclofenac (VOLTAREN) 75 MG EC tablet Take 75 mg by mouth 2 (two) times daily as needed. 11/28/21   [provider]  lisinopril (ZESTRIL) 10 MG tablet Take 10 mg by mouth daily. 11/24/22   [provider]  metFORMIN (GLUCOPHAGE) 1000 MG tablet Take 1,000 mg by mouth 2 (two) times daily. 11/07/21   [provider]      Allergies    Patient has no known allergies.    Review of Systems   Review of Systems  Constitutional:  Negative for chills and fever.  Respiratory:  Negative for shortness of breath.   Cardiovascular:  Negative for chest pain.  Gastrointestinal:  Positive for nausea and vomiting. Negative for abdominal pain.  Genitourinary:  Positive for flank pain.  Musculoskeletal:  Negative for back pain.  All other systems reviewed and are negative.   Physical Exam Updated Vital Signs BP (!) 163/96   Pulse 96   Temp  98.3 F (36.8 C) (Oral)   Resp 14   Ht 5\' 5"  (1.651 m)   Wt 64.4 kg   SpO2 95%   BMI 23.63 kg/m  Physical Exam Vitals and nursing note reviewed.  Constitutional:      Appearance: She is well-developed.  HENT:     Head: Normocephalic and atraumatic.  Cardiovascular:     Rate and Rhythm: Normal rate.  Pulmonary:     Effort: Pulmonary effort is normal.     Breath sounds: No wheezing.  Abdominal:     General: Abdomen is flat. Bowel sounds are normal.     Palpations: Abdomen is soft.     Tenderness: There is no abdominal tenderness. There is right CVA tenderness. There is no guarding.   Skin:    General: Skin is warm and dry.  Neurological:     Mental Status: She is alert.     ED Results / Procedures / Treatments   Labs (all labs ordered are listed, but only abnormal results are displayed) Labs Reviewed  CBC WITH DIFFERENTIAL/PLATELET - Abnormal; Notable for the following components:      Result Value   WBC 16.3 (*)    Neutro Abs 13.7 (*)    Monocytes Absolute 1.1 (*)    All other components within normal limits  COMPREHENSIVE METABOLIC PANEL - Abnormal; Notable for the following components:   Glucose, Bld 267 (*)    Calcium 8.8 (*)    All other components within normal limits  URINALYSIS, ROUTINE W REFLEX MICROSCOPIC - Abnormal; Notable for the following components:   APPearance HAZY (*)    Glucose, UA 50 (*)    Hgb urine dipstick LARGE (*)    Ketones, ur 5 (*)    All other components within normal limits  LIPASE, BLOOD    EKG None  Radiology CT Renal Stone Study  Result Date: 03/07/2023 CLINICAL DATA:  Flank pain.  Concern for kidney stone. EXAM: CT ABDOMEN AND PELVIS WITHOUT CONTRAST TECHNIQUE: Multidetector CT imaging of the abdomen and pelvis was performed following the standard protocol without IV contrast. RADIATION DOSE REDUCTION: This exam was performed according to the departmental dose-optimization program which includes automated exposure control, adjustment of the mA and/or kV according to patient size and/or use of iterative reconstruction technique. COMPARISON:  None Available. FINDINGS: Evaluation of this exam is limited in the absence of intravenous contrast. Lower chest: The visualized lung bases are clear. No intra-abdominal free air or free fluid. Hepatobiliary: No focal liver abnormality is seen. No gallstones, gallbladder wall thickening, or biliary dilatation. Pancreas: Unremarkable. No pancreatic ductal dilatation or surrounding inflammatory changes. Spleen: Normal in size without focal abnormality. Adrenals/Urinary Tract: The adrenal  glands unremarkable there is a 3 mm stone in the distal right ureter with mild right hydronephrosis. Additional punctate nonobstructing calculi noted in the upper pole of the right kidney. The left kidney: Left ureter appear unremarkable. The urinary bladder is collapsed. Stomach/Bowel: There is no bowel obstruction or active inflammation. There is moderate stool throughout the colon. The appendix is normal. Vascular/Lymphatic: Mild aortoiliac atherosclerotic disease. The IVC is unremarkable. No portal venous gas. There is no adenopathy. Reproductive: The uterus is anteverted and grossly unremarkable. No adnexal masses. Other: None Musculoskeletal: Degenerative changes of the spine. No acute osseous pathology. IMPRESSION: 1. A 3 mm distal right ureteral stone with mild right hydronephrosis. 2. Additional punctate nonobstructing right renal upper pole calculi. 3. No bowel obstruction. Normal appendix. 4.  Aortic Atherosclerosis (ICD10-I70.0). Electronically Signed  By: Anner Crete M.D.   On: 03/07/2023 20:51    Procedures Procedures    Medications Ordered in ED Medications  ondansetron (ZOFRAN-ODT) disintegrating tablet 4 mg (4 mg Oral Given 03/07/23 2040)  HYDROcodone-acetaminophen (NORCO/VICODIN) 5-325 MG per tablet 1 tablet (1 tablet Oral Given 03/07/23 2040)  sodium chloride 0.9 % bolus 1,000 mL (0 mLs Intravenous Stopped 03/07/23 2306)  ketorolac (TORADOL) 15 MG/ML injection 15 mg (15 mg Intravenous Given 03/07/23 2227)    ED Course/ Medical Decision Making/ A&P Clinical Course as of 03/07/23 2313  Wed Mar 07, 2023  2201 Glucose(!): 267 No prior hx of DM documented [JS]    Clinical Course User Index [JS] Janeece Fitting, PA-C                             Medical Decision Making Amount and/or Complexity of Data Reviewed Labs: ordered. Decision-making details documented in ED Course. Radiology: ordered.  Risk Prescription drug management.    This patient presents to the ED for  concern of right flank pain, this involves a number of treatment options, and is a complaint that carries with it a high risk of complications and morbidity.  The differential diagnosis includes nephrolithiasis, renal colic, versus MSK.    Co morbidities: Discussed in HPI   Brief History:  See HPI.  EMR reviewed including pt PMHx, past surgical history and past visits to ER.   See HPI for more details   Lab Tests:  I ordered and independently interpreted labs.  The pertinent results include:    Labs notable for CBC with a leukocytosis of 16.3, hemoglobin is stable without signs of anemia.  CMP with no electrolyte derangement, does have a glucose of 267, with no prior documented diabetes.  Creatinine levels within normal limits.  LFTs are normal.  Lipase levels normal.  UA with some large hemoglobin mild ketones, no bacteria or signs of infection stated.   Imaging Studies:  CT Renal stone showed: 1. A 3 mm distal right ureteral stone with mild right  hydronephrosis.  2. Additional punctate nonobstructing right renal upper pole  calculi.  3. No bowel obstruction. Normal appendix.  4.  Aortic Atherosclerosis (ICD10-I70.0).   Medicines ordered:  I ordered medication including norco, zofran  for symptomatic treatment Reevaluation of the patient after these medicines showed that the patient improved I have reviewed the patients home medicines and have made adjustments as needed -After obtaining verification of a normal creatinine, Toradol was also given along with bolus.  Reevaluation:  After the interventions noted above I re-evaluated patient and found that they have :improved   Social Determinants of Health:  The patient's social determinants of health were a factor in the care of this patient  Problem List / ED Course:  Patient with no underlying medical history reports right flank pain which began suddenly approximately 5 hours prior to arrival in the ED.  Patient  reports he did not take anything, pain was so severe in nature that she had nausea along with vomiting.  She does not report any prior history of kidney stones.  On evaluation there is severe right flank tenderness.  No hypoxia, no tachycardia to suggest respiratory component.  Interpretation of her labs reveals a CBC with leukocytosis of 13.6, hemoglobin is within normal limits.  CMP without any electrolyte abnormality, creatinine level is normal.  LFTs are within normal limits.  UA without any nitrate, leukocytes or concern for infection.  She does have a large amount of hemoglobin, suspicion for renal colic.  Lipase level is also normal.  CT renal did not show a 3 mm stone, she has an appropriate kidney function, does have improvement after Norco, given additional Toradol and she is now pain-free after this and fluids.  I do feel the patient is adequate for outpatient follow-up with [facility].  Sent home with Norco, Flomax, Zofran for symptomatic treatment.  Patient is hemodynamically stable for discharge.   Dispostion:  After consideration of the diagnostic results and the patients response to treatment, I feel that the patent would benefit from follow up with urology along with symptomatic treatment.    Portions of this note were generated with [employer]. Dictation errors may occur despite best attempts at proofreading.   Final Clinical Impression(s) / ED Diagnoses Final diagnoses:  Right flank pain    Rx / DC Orders ED Discharge Orders          Ordered    HYDROcodone-acetaminophen (NORCO/VICODIN) 5-325 MG tablet  Every 4 hours PRN        03/07/23 2305    ondansetron (ZOFRAN) 4 MG tablet  Every 6 hours        03/07/23 2305    tamsulosin (FLOMAX) 0.4 MG CAPS capsule  Daily        03/07/23 2305              Janeece Fitting, PA-C 03/07/23 2313    Valarie Merino, MD 03/09/23 [phone]

## 2023-03-07 NOTE — Discharge Instructions (Signed)
You were given medication to help with your pain, please take 1 tablet every 4 hours as needed for pain.  The second medication is Zofran, this medication to help with nausea and vomiting, placed as a medication as needed.  The third medication is called Flomax, this should help with passing of your stone, please take this medication daily for the next 7 days.  Number to [facility] is attached to your paperwork, please schedule an appointment for further management of your kidney stone.

## 2023-03-07 NOTE — ED Provider Triage Note (Signed)
Emergency Medicine Provider Triage Evaluation Note  [name] , a 57 y.o. female  was evaluated in triage.  Pt complains of right flank pain of sudden onset around 3 PM today.  Worsening pain with any movement.  Also endorses some hematuria and 1 episode of nausea and vomiting.  Has not taken anything for pain control.  No other symptoms reported.  Review of Systems  Positive: Right flank pain, nausea, vomiting Negative: fever  Physical Exam  BP (!) 152/118   Pulse 94   Temp 97.9 F (36.6 C) (Oral)   Resp 16   Ht 5\' 5"  (1.651 m)   Wt 64.4 kg   SpO2 97%   BMI 23.63 kg/m  Gen:   Awake, no distress   Resp:  Normal effort  MSK:   Moves extremities without difficulty  Other:    Medical Decision Making  Medically screening exam initiated at 8:01 PM.  Appropriate orders placed.  Casper Harrison was informed that the remainder of the evaluation will be completed by another provider, this initial triage assessment does not replace that evaluation, and the importance of remaining in the ED until their evaluation is complete.     Janeece Fitting, PA-C 03/07/23 2004

## 2024-03-06 ENCOUNTER — Encounter: Payer: Self-pay | Admitting: Nurse Practitioner

## 2024-03-06 ENCOUNTER — Ambulatory Visit: Payer: PRIVATE HEALTH INSURANCE | Admitting: Nurse Practitioner

## 2024-03-06 VITALS — BP 128/84 | HR 94 | Temp 98.2°F

## 2024-03-06 DIAGNOSIS — Z789 Other specified health status: Secondary | ICD-10-CM

## 2024-03-06 MED ORDER — ALBUTEROL SULFATE HFA 108 (90 BASE) MCG/ACT IN AERS: 2.0000 | INHALATION_SPRAY | Freq: Four times a day (QID) | RESPIRATORY_TRACT | 1 refills | Status: AC | PRN

## 2024-03-06 NOTE — Progress Notes (Signed)
 [facility]  Subjective:  Patient ID: [name], female    DOB: 1966-11-30  Age: 58 y.o. MRN: [id number]  CC: wellness exam   HPI LADESHA PACINI presents for wellness exam visit for insurance benefit.  Patient has a PCP: Dr. Yetta Barre at [facility] PMH significant for: HTN, HLD, T2DM, emphysema  Last labs per PCP were completed: 2025  Health Maintenance:  Colonoscopy: had at age 12, was negative, repeat in 10 years.  Mammogram: Feb 2024    Smoker: quit smoking 2 weeks ago. 1/2 pack a day since age 57.   Immunizations:  Shingrix- completed.  Flu- yearly    Lifestyle: Diet- admits to not having a good appetite.  Exercise- very active at work.    Reports new diagnosis of emphysema. Would like refill on albuterol. She has a referral to pulmonology pending.   Past Medical History:  Diagnosis Date   Diabetes mellitus without complication (HCC)    Hearing difficulty    Hypertension    Incontinence of urine 11/01/2011   Menopausal syndrome 11/01/2011   Thyroid disease    overactive- no meds    Past Surgical History:  Procedure Laterality Date   ENDOMETRIAL ABLATION W/ NOVASURE  10/11/2010   TONSILLECTOMY AND ADENOIDECTOMY  age 46   TUBAL LIGATION  2003   WISDOM TOOTH EXTRACTION  1987    Outpatient Medications Prior to Visit  Medication Sig Dispense Refill   atorvastatin (LIPITOR) 80 MG tablet Take 80 mg by mouth daily.     cyclobenzaprine (FLEXERIL) 10 MG tablet Take 1 tablet (10 mg total) by mouth 2 (two) times daily as needed for muscle spasms. 20 tablet 0   JANUMET 50-1000 MG tablet Take 1 tablet by mouth 2 (two) times daily.     lisinopril (ZESTRIL) 10 MG tablet Take 10 mg by mouth daily.     albuterol (VENTOLIN HFA) 108 (90 Base) MCG/ACT inhaler Inhale 2 puffs into the lungs every 6 (six) hours as needed for wheezing or shortness of breath. 8 g 0   Aspirin-Salicylamide-Caffeine (BC FAST PAIN RELIEF) 650-195-33.3 MG PACK Take 1  Package by mouth daily as needed (PAIN).     benzonatate (TESSALON) 100 MG capsule Take 1 capsule (100 mg total) by mouth 2 (two) times daily as needed for cough. 20 capsule 0   diclofenac (VOLTAREN) 75 MG EC tablet Take 75 mg by mouth 2 (two) times daily as needed.     metFORMIN (GLUCOPHAGE) 1000 MG tablet Take 1,000 mg by mouth 2 (two) times daily.     ondansetron (ZOFRAN) 4 MG tablet Take 1 tablet (4 mg total) by mouth every 6 (six) hours. 12 tablet 0   No facility-administered medications prior to visit.    ROS Review of Systems  HENT:  Positive for hearing loss (chronic).   Eyes:  Positive for visual disturbance (some blurred vision, wears reading glasses).  Respiratory:  Positive for cough and shortness of breath (recent diagnosis of emphysema.).   Cardiovascular:  Negative for chest pain.  Gastrointestinal:  Negative for diarrhea, nausea and vomiting.  Musculoskeletal:  Positive for arthralgias and back pain.  Neurological:  Positive for dizziness (sometimes if she doesn't eat.). Negative for headaches.    Objective:  BP 128/84 (BP Location: Left Arm, Patient Position: Sitting, Cuff Size: Normal)   Pulse 94   Temp 98.2 F (36.8 C)   SpO2 94%   Physical Exam Constitutional:      General: She is not in  acute distress. HENT:     Head: Normocephalic.     Right Ear: External ear normal. There is impacted cerumen.     Left Ear: Tympanic membrane, ear canal and external ear normal.     Mouth/Throat:     Mouth: Mucous membranes are moist.     Comments: Uvula split Eyes:     Pupils: Pupils are equal, round, and reactive to light.  Cardiovascular:     Rate and Rhythm: Normal rate and regular rhythm.     Heart sounds: Normal heart sounds.  Pulmonary:     Effort: Pulmonary effort is normal.     Breath sounds: Wheezing (slight wheezing right lobe) present.  Abdominal:     Palpations: Abdomen is soft.  Musculoskeletal:        General: Normal range of motion.  Neurological:      General: No focal deficit present.     Mental Status: She is alert and oriented to person, place, and time.  Psychiatric:        Mood and Affect: Mood normal.        Behavior: Behavior normal.      Assessment & Plan:    Konner was seen today for wellness exam.  Diagnoses and all orders for this visit:  Participant in health and wellness plan Adult wellness physical was conducted today. Importance of diet and exercise were discussed in detail.  We reviewed immunizations and gave recommendations regarding current immunization needed for age.  Preventative health exams needed: mammogram due. Aware I can order this if she is unable to complete with PCP.   Patient was advised yearly wellness exam and follow-up with PCP as recommended. Will refill Albuterol today.   -     albuterol (VENTOLIN HFA) 108 (90 Base) MCG/ACT inhaler; Inhale 2 puffs into the lungs every 6 (six) hours as needed for wheezing or shortness of breath.    No orders of the defined types were placed in this encounter.   Meds ordered this encounter  Medications   albuterol (VENTOLIN HFA) 108 (90 Base) MCG/ACT inhaler    Sig: Inhale 2 puffs into the lungs every 6 (six) hours as needed for wheezing or shortness of breath.    Dispense:  8 g    Refill:  1    Supervising Provider:   Erasmo Downer [[id number]]    Follow-up: as needed.

## 2024-03-14 ENCOUNTER — Encounter (HOSPITAL_BASED_OUTPATIENT_CLINIC_OR_DEPARTMENT_OTHER): Payer: Self-pay
